# Patient Record
Sex: Female | Born: 2000 | Race: White | Hispanic: No | Marital: Single | Smoking: Former smoker
Health system: Southern US, Community
[De-identification: ages and names within clinical notes are randomized; demographics above are authoritative.]

## PROBLEM LIST (undated history)

## (undated) ENCOUNTER — Inpatient Hospital Stay: Payer: Self-pay

## (undated) DIAGNOSIS — F32A Depression, unspecified: Secondary | ICD-10-CM

## (undated) DIAGNOSIS — F1290 Cannabis use, unspecified, uncomplicated: Secondary | ICD-10-CM

## (undated) DIAGNOSIS — D649 Anemia, unspecified: Secondary | ICD-10-CM

## (undated) DIAGNOSIS — N879 Dysplasia of cervix uteri, unspecified: Secondary | ICD-10-CM

## (undated) DIAGNOSIS — Z8659 Personal history of other mental and behavioral disorders: Secondary | ICD-10-CM

## (undated) DIAGNOSIS — K3532 Acute appendicitis with perforation and localized peritonitis, without abscess: Secondary | ICD-10-CM

## (undated) DIAGNOSIS — R0600 Dyspnea, unspecified: Secondary | ICD-10-CM

## (undated) DIAGNOSIS — I300 Acute nonspecific idiopathic pericarditis: Secondary | ICD-10-CM

## (undated) DIAGNOSIS — I319 Disease of pericardium, unspecified: Secondary | ICD-10-CM

## (undated) DIAGNOSIS — K219 Gastro-esophageal reflux disease without esophagitis: Secondary | ICD-10-CM

## (undated) DIAGNOSIS — R519 Headache, unspecified: Secondary | ICD-10-CM

## (undated) DIAGNOSIS — Z87440 Personal history of urinary (tract) infections: Secondary | ICD-10-CM

## (undated) DIAGNOSIS — Z720 Tobacco use: Secondary | ICD-10-CM

## (undated) HISTORY — DX: Acute appendicitis with perforation and localized peritonitis, without abscess: K35.32

## (undated) HISTORY — DX: Headache, unspecified: R51.9

## (undated) HISTORY — DX: Disease of pericardium, unspecified: I31.9

## (undated) HISTORY — DX: Personal history of urinary (tract) infections: Z87.440

## (undated) HISTORY — DX: Anemia, unspecified: D64.9

## (undated) HISTORY — DX: Dyspnea, unspecified: R06.00

## (undated) HISTORY — DX: Acute nonspecific idiopathic pericarditis: I30.0

## (undated) HISTORY — DX: Personal history of other mental and behavioral disorders: Z86.59

## (undated) HISTORY — DX: Depression, unspecified: F32.A

## (undated) HISTORY — DX: Cannabis use, unspecified, uncomplicated: F12.90

---
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
## 2018-01-06 ENCOUNTER — Encounter: Payer: Self-pay | Admitting: Obstetrics & Gynecology

## 2018-01-06 NOTE — Telephone Encounter (Signed)
Can you please send in to pharmacy

## 2018-01-08 ENCOUNTER — Other Ambulatory Visit: Payer: Self-pay | Admitting: Obstetrics & Gynecology

## 2018-01-08 MED ORDER — PRENATAL MULTIVITAMIN CH: 1.0000 | ORAL_TABLET | Freq: Every day | ORAL | 4 refills | Status: DC

## 2018-01-08 NOTE — Telephone Encounter (Signed)
done

## 2018-01-14 ENCOUNTER — Ambulatory Visit (INDEPENDENT_AMBULATORY_CARE_PROVIDER_SITE_OTHER): Payer: Medicaid Other | Admitting: Obstetrics & Gynecology

## 2018-01-14 ENCOUNTER — Other Ambulatory Visit: Payer: Medicaid Other

## 2018-01-14 VITALS — BP 100/60 | Wt 159.0 lb

## 2018-01-14 DIAGNOSIS — Z130 Encounter for screening for diseases of the blood and blood-forming organs and certain disorders involving the immune mechanism: Secondary | ICD-10-CM

## 2018-01-14 DIAGNOSIS — Z87898 Personal history of other specified conditions: Secondary | ICD-10-CM

## 2018-01-14 DIAGNOSIS — Z3402 Encounter for supervision of normal first pregnancy, second trimester: Secondary | ICD-10-CM

## 2018-01-14 DIAGNOSIS — Z3490 Encounter for supervision of normal pregnancy, unspecified, unspecified trimester: Secondary | ICD-10-CM

## 2018-01-14 DIAGNOSIS — F1911 Other psychoactive substance abuse, in remission: Secondary | ICD-10-CM

## 2018-01-14 DIAGNOSIS — Z3A28 28 weeks gestation of pregnancy: Secondary | ICD-10-CM

## 2018-01-14 DIAGNOSIS — Z113 Encounter for screening for infections with a predominantly sexual mode of transmission: Secondary | ICD-10-CM

## 2018-01-14 DIAGNOSIS — Z131 Encounter for screening for diabetes mellitus: Secondary | ICD-10-CM

## 2018-01-14 NOTE — Progress Notes (Signed)
  Subjective  Fetal Movement? yes Contractions? no Leaking Fluid? no Vaginal Bleeding? no  Objective  BP 100/60   Wt 159 lb (72.1 kg)   LMP 06/14/2017 (Exact Date)   BMI 28.17 kg/m  General: NAD Pumonary: no increased work of breathing Abdomen: gravid, non-tender Extremities: no edema Psychiatric: mood appropriate, affect full  Assessment  17 y.o. G2P0 at [redacted]w[redacted]d by  04/08/2018, by Ultrasound presenting for routine prenatal visit  Plan   Problem List Items Addressed This Visit      Other   Supervision of normal first teen pregnancy in second trimester    Other Visit Diagnoses    [redacted] weeks gestation of pregnancy    -  Primary   History of substance abuse       Relevant Orders   Drug Screen, Urine    Glucola today Labor precautions discussed Edema prevention discussed  Barnett Applebaum, MD, [facility], [facility] 01/14/2018  11:12 AM

## 2018-01-14 NOTE — Patient Instructions (Signed)
Third Trimester of Pregnancy The third trimester is from week 28 through week 40 (months 7 through 9). The third trimester is a time when the unborn baby (fetus) is growing rapidly. At the end of the ninth month, the fetus is about 20 inches in length and weighs 6-10 pounds. Body changes during your third trimester Your body will continue to go through many changes during pregnancy. The changes vary from woman to woman. During the third trimester:  Your weight will continue to increase. You can expect to gain 25-35 pounds (11-16 kg) by the end of the pregnancy.  You may begin to get stretch marks on your hips, abdomen, and breasts.  You may urinate more often because the fetus is moving lower into your pelvis and pressing on your bladder.  You may develop or continue to have heartburn. This is caused by increased hormones that slow down muscles in the digestive tract.  You may develop or continue to have constipation because increased hormones slow digestion and cause the muscles that push waste through your intestines to relax.  You may develop hemorrhoids. These are swollen veins (varicose veins) in the rectum that can itch or be painful.  You may develop swollen, bulging veins (varicose veins) in your legs.  You may have increased body aches in the pelvis, back, or thighs. This is due to weight gain and increased hormones that are relaxing your joints.  You may have changes in your hair. These can include thickening of your hair, rapid growth, and changes in texture. Some women also have hair loss during or after pregnancy, or hair that feels dry or thin. Your hair will most likely return to normal after your baby is born.  Your breasts will continue to grow and they will continue to become tender. A yellow fluid (colostrum) may leak from your breasts. This is the first milk you are producing for your baby.  Your belly button may stick out.  You may notice more swelling in your hands,  face, or ankles.  You may have increased tingling or numbness in your hands, arms, and legs. The skin on your belly may also feel numb.  You may feel short of breath because of your expanding uterus.  You may have more problems sleeping. This can be caused by the size of your belly, increased need to urinate, and an increase in your body's metabolism.  You may notice the fetus "dropping," or moving lower in your abdomen (lightening).  You may have increased vaginal discharge.  You may notice your joints feel loose and you may have pain around your pelvic bone.  What to expect at prenatal visits You will have prenatal exams every 2 weeks until week 36. Then you will have weekly prenatal exams. During a routine prenatal visit:  You will be weighed to make sure you and the baby are growing normally.  Your blood pressure will be taken.  Your abdomen will be measured to track your baby's growth.  The fetal heartbeat will be listened to.  Any test results from the previous visit will be discussed.  You may have a cervical check near your due date to see if your cervix has softened or thinned (effaced).  You will be tested for Group B streptococcus. This happens between 35 and 37 weeks.  Your health care provider may ask you:  What your birth plan is.  How you are feeling.  If you are feeling the baby move.  If you have had   any abnormal symptoms, such as leaking fluid, bleeding, severe headaches, or abdominal cramping.  If you are using any tobacco products, including cigarettes, chewing tobacco, and electronic cigarettes.  If you have any questions.  Other tests or screenings that may be performed during your third trimester include:  Blood tests that check for low iron levels (anemia).  Fetal testing to check the health, activity level, and growth of the fetus. Testing is done if you have certain medical conditions or if there are problems during the  pregnancy.  Nonstress test (NST). This test checks the health of your baby to make sure there are no signs of problems, such as the baby not getting enough oxygen. During this test, a belt is placed around your belly. The baby is made to move, and its heart rate is monitored during movement.  What is false labor? False labor is a condition in which you feel small, irregular tightenings of the muscles in the womb (contractions) that usually go away with rest, changing position, or drinking water. These are called Braxton Hicks contractions. Contractions may last for hours, days, or even weeks before true labor sets in. If contractions come at regular intervals, become more frequent, increase in intensity, or become painful, you should see your health care provider. What are the signs of labor?  Abdominal cramps.  Regular contractions that start at 10 minutes apart and become stronger and more frequent with time.  Contractions that start on the top of the uterus and spread down to the lower abdomen and back.  Increased pelvic pressure and dull back pain.  A watery or bloody mucus discharge that comes from the vagina.  Leaking of amniotic fluid. This is also known as your "water breaking." It could be a slow trickle or a gush. Let your health care provider know if it has a color or strange odor. If you have any of these signs, call your health care provider right away, even if it is before your due date. Follow these instructions at home: Medicines  Follow your health care provider's instructions regarding medicine use. Specific medicines may be either safe or unsafe to take during pregnancy.  Take a prenatal vitamin that contains at least 600 micrograms (mcg) of folic acid.  If you develop constipation, try taking a stool softener if your health care provider approves. Eating and drinking  Eat a balanced diet that includes fresh fruits and vegetables, whole grains, good sources of protein  such as meat, eggs, or tofu, and low-fat dairy. Your health care provider will help you determine the amount of weight gain that is right for you.  Avoid raw meat and uncooked cheese. These carry germs that can cause birth defects in the baby.  If you have low calcium intake from food, talk to your health care provider about whether you should take a daily calcium supplement.  Eat four or five small meals rather than three large meals a day.  Limit foods that are high in fat and processed sugars, such as fried and sweet foods.  To prevent constipation: ? Drink enough fluid to keep your urine clear or pale yellow. ? Eat foods that are high in fiber, such as fresh fruits and vegetables, whole grains, and beans. Activity  Exercise only as directed by your health care provider. Most women can continue their usual exercise routine during pregnancy. Try to exercise for 30 minutes at least 5 days a week. Stop exercising if you experience uterine contractions.  Avoid heavy   lifting.  Do not exercise in extreme heat or humidity, or at high altitudes.  Wear low-heel, comfortable shoes.  Practice good posture.  You may continue to have sex unless your health care provider tells you otherwise. Relieving pain and discomfort  Take frequent breaks and rest with your legs elevated if you have leg cramps or low back pain.  Take warm sitz baths to soothe any pain or discomfort caused by hemorrhoids. Use hemorrhoid cream if your health care provider approves.  Wear a good support bra to prevent discomfort from breast tenderness.  If you develop varicose veins: ? Wear support pantyhose or compression stockings as told by your healthcare provider. ? Elevate your feet for 15 minutes, 3-4 times a day. Prenatal care  Write down your questions. Take them to your prenatal visits.  Keep all your prenatal visits as told by your health care provider. This is important. Safety  Wear your seat belt at  all times when driving.  Make a list of emergency phone numbers, including numbers for family, friends, the hospital, and police and fire departments. General instructions  Avoid cat litter boxes and soil used by cats. These carry germs that can cause birth defects in the baby. If you have a cat, ask someone to clean the litter box for you.  Do not travel far distances unless it is absolutely necessary and only with the approval of your health care provider.  Do not use hot tubs, steam rooms, or saunas.  Do not drink alcohol.  Do not use any products that contain nicotine or tobacco, such as cigarettes and e-cigarettes. If you need help quitting, ask your health care provider.  Do not use any medicinal herbs or unprescribed drugs. These chemicals affect the formation and growth of the baby.  Do not douche or use tampons or scented sanitary pads.  Do not cross your legs for long periods of time.  To prepare for the arrival of your baby: ? Take prenatal classes to understand, practice, and ask questions about labor and delivery. ? Make a trial run to the hospital. ? Visit the hospital and tour the maternity area. ? Arrange for maternity or paternity leave through employers. ? Arrange for family and friends to take care of pets while you are in the hospital. ? Purchase a rear-facing car seat and make sure you know how to install it in your car. ? Pack your hospital bag. ? Prepare the baby's nursery. Make sure to remove all pillows and stuffed animals from the baby's crib to prevent suffocation.  Visit your dentist if you have not gone during your pregnancy. Use a soft toothbrush to brush your teeth and be gentle when you floss. Contact a health care provider if:  You are unsure if you are in labor or if your water has broken.  You become dizzy.  You have mild pelvic cramps, pelvic pressure, or nagging pain in your abdominal area.  You have lower back pain.  You have persistent  nausea, vomiting, or diarrhea.  You have an unusual or bad smelling vaginal discharge.  You have pain when you urinate. Get help right away if:  Your water breaks before 37 weeks.  You have regular contractions less than 5 minutes apart before 37 weeks.  You have a fever.  You are leaking fluid from your vagina.  You have spotting or bleeding from your vagina.  You have severe abdominal pain or cramping.  You have rapid weight loss or weight gain.    You have shortness of breath with chest pain.  You notice sudden or extreme swelling of your face, hands, ankles, feet, or legs.  Your baby makes fewer than 10 movements in 2 hours.  You have severe headaches that do not go away when you take medicine.  You have vision changes. Summary  The third trimester is from week 28 through week 40, months 7 through 9. The third trimester is a time when the unborn baby (fetus) is growing rapidly.  During the third trimester, your discomfort may increase as you and your baby continue to gain weight. You may have abdominal, leg, and back pain, sleeping problems, and an increased need to urinate.  During the third trimester your breasts will keep growing and they will continue to become tender. A yellow fluid (colostrum) may leak from your breasts. This is the first milk you are producing for your baby.  False labor is a condition in which you feel small, irregular tightenings of the muscles in the womb (contractions) that eventually go away. These are called Braxton Hicks contractions. Contractions may last for hours, days, or even weeks before true labor sets in.  Signs of labor can include: abdominal cramps; regular contractions that start at 10 minutes apart and become stronger and more frequent with time; watery or bloody mucus discharge that comes from the vagina; increased pelvic pressure and dull back pain; and leaking of amniotic fluid. This information is not intended to replace advice  given to you by your health care provider. Make sure you discuss any questions you have with your health care provider. Document Released: 08/06/2001 Document Revised: 01/18/2016 Document Reviewed: 10/13/2012 Elsevier Interactive Patient Education  2017 [employer]  

## 2018-01-15 LAB — 28 WEEK RH+PANEL
BASOS ABS: 0 10*3/uL (ref 0.0–0.2)
Basos: 0 %
EOS (ABSOLUTE): 0.4 10*3/uL (ref 0.0–0.4)
EOS: 3 %
Gestational Diabetes Screen: 97 mg/dL (ref 65–139)
HIV SCREEN 4TH GENERATION: NONREACTIVE
Hematocrit: 30.4 % — ABNORMAL LOW (ref 34.0–46.6)
Hemoglobin: 10.1 g/dL — ABNORMAL LOW (ref 11.1–15.9)
IMMATURE GRANULOCYTES: 5 %
Immature Grans (Abs): 0.6 10*3/uL — ABNORMAL HIGH (ref 0.0–0.1)
Lymphocytes Absolute: 2.2 10*3/uL (ref 0.7–3.1)
Lymphs: 19 %
MCH: 29.4 pg (ref 26.6–33.0)
MCHC: 33.2 g/dL (ref 31.5–35.7)
MCV: 89 fL (ref 79–97)
MONOCYTES: 12 %
Monocytes Absolute: 1.4 10*3/uL — ABNORMAL HIGH (ref 0.1–0.9)
NEUTROS PCT: 61 %
Neutrophils Absolute: 7.1 10*3/uL — ABNORMAL HIGH (ref 1.4–7.0)
PLATELETS: 217 10*3/uL (ref 150–450)
RBC: 3.43 x10E6/uL — ABNORMAL LOW (ref 3.77–5.28)
RDW: 12.9 % (ref 12.3–15.4)
RPR Ser Ql: NONREACTIVE
WBC: 11.7 10*3/uL — ABNORMAL HIGH (ref 3.4–10.8)

## 2018-01-15 LAB — DRUG SCREEN, URINE
AMPHETAMINES, URINE: NEGATIVE ng/mL
BARBITURATE SCREEN URINE: NEGATIVE ng/mL
BENZODIAZEPINE QUANT UR: NEGATIVE ng/mL
Cannabinoid Quant, Ur: POSITIVE ng/mL — AB
Cocaine (Metab.): NEGATIVE ng/mL
OPIATE QUANT UR: NEGATIVE ng/mL
PCP Quant, Ur: NEGATIVE ng/mL

## 2018-01-28 ENCOUNTER — Encounter: Payer: Self-pay | Admitting: Advanced Practice Midwife

## 2018-01-28 ENCOUNTER — Ambulatory Visit (INDEPENDENT_AMBULATORY_CARE_PROVIDER_SITE_OTHER): Payer: Medicaid Other | Admitting: Advanced Practice Midwife

## 2018-01-28 VITALS — BP 100/60 | Wt 156.0 lb

## 2018-01-28 DIAGNOSIS — Z3403 Encounter for supervision of normal first pregnancy, third trimester: Secondary | ICD-10-CM

## 2018-01-28 DIAGNOSIS — Z3A30 30 weeks gestation of pregnancy: Secondary | ICD-10-CM | POA: Diagnosis not present

## 2018-01-28 DIAGNOSIS — Z23 Encounter for immunization: Secondary | ICD-10-CM

## 2018-01-28 NOTE — Progress Notes (Signed)
No concerns. Tdap and blood tx signed today. rj

## 2018-01-28 NOTE — Patient Instructions (Signed)
Third Trimester of Pregnancy The third trimester is from week 28 through week 40 (months 7 through 9). The third trimester is a time when the unborn baby (fetus) is growing rapidly. At the end of the ninth month, the fetus is about 20 inches in length and weighs 6-10 pounds. Body changes during your third trimester Your body will continue to go through many changes during pregnancy. The changes vary from woman to woman. During the third trimester:  Your weight will continue to increase. You can expect to gain 25-35 pounds (11-16 kg) by the end of the pregnancy.  You may begin to get stretch marks on your hips, abdomen, and breasts.  You may urinate more often because the fetus is moving lower into your pelvis and pressing on your bladder.  You may develop or continue to have heartburn. This is caused by increased hormones that slow down muscles in the digestive tract.  You may develop or continue to have constipation because increased hormones slow digestion and cause the muscles that push waste through your intestines to relax.  You may develop hemorrhoids. These are swollen veins (varicose veins) in the rectum that can itch or be painful.  You may develop swollen, bulging veins (varicose veins) in your legs.  You may have increased body aches in the pelvis, back, or thighs. This is due to weight gain and increased hormones that are relaxing your joints.  You may have changes in your hair. These can include thickening of your hair, rapid growth, and changes in texture. Some women also have hair loss during or after pregnancy, or hair that feels dry or thin. Your hair will most likely return to normal after your baby is born.  Your breasts will continue to grow and they will continue to become tender. A yellow fluid (colostrum) may leak from your breasts. This is the first milk you are producing for your baby.  Your belly button may stick out.  You may notice more swelling in your hands,  face, or ankles.  You may have increased tingling or numbness in your hands, arms, and legs. The skin on your belly may also feel numb.  You may feel short of breath because of your expanding uterus.  You may have more problems sleeping. This can be caused by the size of your belly, increased need to urinate, and an increase in your body's metabolism.  You may notice the fetus "dropping," or moving lower in your abdomen (lightening).  You may have increased vaginal discharge.  You may notice your joints feel loose and you may have pain around your pelvic bone.  What to expect at prenatal visits You will have prenatal exams every 2 weeks until week 36. Then you will have weekly prenatal exams. During a routine prenatal visit:  You will be weighed to make sure you and the baby are growing normally.  Your blood pressure will be taken.  Your abdomen will be measured to track your baby's growth.  The fetal heartbeat will be listened to.  Any test results from the previous visit will be discussed.  You may have a cervical check near your due date to see if your cervix has softened or thinned (effaced).  You will be tested for Group B streptococcus. This happens between 35 and 37 weeks.  Your health care provider may ask you:  What your birth plan is.  How you are feeling.  If you are feeling the baby move.  If you have had   any abnormal symptoms, such as leaking fluid, bleeding, severe headaches, or abdominal cramping.  If you are using any tobacco products, including cigarettes, chewing tobacco, and electronic cigarettes.  If you have any questions.  Other tests or screenings that may be performed during your third trimester include:  Blood tests that check for low iron levels (anemia).  Fetal testing to check the health, activity level, and growth of the fetus. Testing is done if you have certain medical conditions or if there are problems during the  pregnancy.  Nonstress test (NST). This test checks the health of your baby to make sure there are no signs of problems, such as the baby not getting enough oxygen. During this test, a belt is placed around your belly. The baby is made to move, and its heart rate is monitored during movement.  What is false labor? False labor is a condition in which you feel small, irregular tightenings of the muscles in the womb (contractions) that usually go away with rest, changing position, or drinking water. These are called Braxton Hicks contractions. Contractions may last for hours, days, or even weeks before true labor sets in. If contractions come at regular intervals, become more frequent, increase in intensity, or become painful, you should see your health care provider. What are the signs of labor?  Abdominal cramps.  Regular contractions that start at 10 minutes apart and become stronger and more frequent with time.  Contractions that start on the top of the uterus and spread down to the lower abdomen and back.  Increased pelvic pressure and dull back pain.  A watery or bloody mucus discharge that comes from the vagina.  Leaking of amniotic fluid. This is also known as your "water breaking." It could be a slow trickle or a gush. Let your health care provider know if it has a color or strange odor. If you have any of these signs, call your health care provider right away, even if it is before your due date. Follow these instructions at home: Medicines  Follow your health care provider's instructions regarding medicine use. Specific medicines may be either safe or unsafe to take during pregnancy.  Take a prenatal vitamin that contains at least 600 micrograms (mcg) of folic acid.  If you develop constipation, try taking a stool softener if your health care provider approves. Eating and drinking  Eat a balanced diet that includes fresh fruits and vegetables, whole grains, good sources of protein  such as meat, eggs, or tofu, and low-fat dairy. Your health care provider will help you determine the amount of weight gain that is right for you.  Avoid raw meat and uncooked cheese. These carry germs that can cause birth defects in the baby.  If you have low calcium intake from food, talk to your health care provider about whether you should take a daily calcium supplement.  Eat four or five small meals rather than three large meals a day.  Limit foods that are high in fat and processed sugars, such as fried and sweet foods.  To prevent constipation: ? Drink enough fluid to keep your urine clear or pale yellow. ? Eat foods that are high in fiber, such as fresh fruits and vegetables, whole grains, and beans. Activity  Exercise only as directed by your health care provider. Most women can continue their usual exercise routine during pregnancy. Try to exercise for 30 minutes at least 5 days a week. Stop exercising if you experience uterine contractions.  Avoid heavy   lifting.  Do not exercise in extreme heat or humidity, or at high altitudes.  Wear low-heel, comfortable shoes.  Practice good posture.  You may continue to have sex unless your health care provider tells you otherwise. Relieving pain and discomfort  Take frequent breaks and rest with your legs elevated if you have leg cramps or low back pain.  Take warm sitz baths to soothe any pain or discomfort caused by hemorrhoids. Use hemorrhoid cream if your health care provider approves.  Wear a good support bra to prevent discomfort from breast tenderness.  If you develop varicose veins: ? Wear support pantyhose or compression stockings as told by your healthcare provider. ? Elevate your feet for 15 minutes, 3-4 times a day. Prenatal care  Write down your questions. Take them to your prenatal visits.  Keep all your prenatal visits as told by your health care provider. This is important. Safety  Wear your seat belt at  all times when driving.  Make a list of emergency phone numbers, including numbers for family, friends, the hospital, and police and fire departments. General instructions  Avoid cat litter boxes and soil used by cats. These carry germs that can cause birth defects in the baby. If you have a cat, ask someone to clean the litter box for you.  Do not travel far distances unless it is absolutely necessary and only with the approval of your health care provider.  Do not use hot tubs, steam rooms, or saunas.  Do not drink alcohol.  Do not use any products that contain nicotine or tobacco, such as cigarettes and e-cigarettes. If you need help quitting, ask your health care provider.  Do not use any medicinal herbs or unprescribed drugs. These chemicals affect the formation and growth of the baby.  Do not douche or use tampons or scented sanitary pads.  Do not cross your legs for long periods of time.  To prepare for the arrival of your baby: ? Take prenatal classes to understand, practice, and ask questions about labor and delivery. ? Make a trial run to the hospital. ? Visit the hospital and tour the maternity area. ? Arrange for maternity or paternity leave through employers. ? Arrange for family and friends to take care of pets while you are in the hospital. ? Purchase a rear-facing car seat and make sure you know how to install it in your car. ? Pack your hospital bag. ? Prepare the baby's nursery. Make sure to remove all pillows and stuffed animals from the baby's crib to prevent suffocation.  Visit your dentist if you have not gone during your pregnancy. Use a soft toothbrush to brush your teeth and be gentle when you floss. Contact a health care provider if:  You are unsure if you are in labor or if your water has broken.  You become dizzy.  You have mild pelvic cramps, pelvic pressure, or nagging pain in your abdominal area.  You have lower back pain.  You have persistent  nausea, vomiting, or diarrhea.  You have an unusual or bad smelling vaginal discharge.  You have pain when you urinate. Get help right away if:  Your water breaks before 37 weeks.  You have regular contractions less than 5 minutes apart before 37 weeks.  You have a fever.  You are leaking fluid from your vagina.  You have spotting or bleeding from your vagina.  You have severe abdominal pain or cramping.  You have rapid weight loss or weight gain.    You have shortness of breath with chest pain.  You notice sudden or extreme swelling of your face, hands, ankles, feet, or legs.  Your baby makes fewer than 10 movements in 2 hours.  You have severe headaches that do not go away when you take medicine.  You have vision changes. Summary  The third trimester is from week 28 through week 40, months 7 through 9. The third trimester is a time when the unborn baby (fetus) is growing rapidly.  During the third trimester, your discomfort may increase as you and your baby continue to gain weight. You may have abdominal, leg, and back pain, sleeping problems, and an increased need to urinate.  During the third trimester your breasts will keep growing and they will continue to become tender. A yellow fluid (colostrum) may leak from your breasts. This is the first milk you are producing for your baby.  False labor is a condition in which you feel small, irregular tightenings of the muscles in the womb (contractions) that eventually go away. These are called Braxton Hicks contractions. Contractions may last for hours, days, or even weeks before true labor sets in.  Signs of labor can include: abdominal cramps; regular contractions that start at 10 minutes apart and become stronger and more frequent with time; watery or bloody mucus discharge that comes from the vagina; increased pelvic pressure and dull back pain; and leaking of amniotic fluid. This information is not intended to replace advice  given to you by your health care provider. Make sure you discuss any questions you have with your health care provider. Document Released: 08/06/2001 Document Revised: 01/18/2016 Document Reviewed: 10/13/2012 Elsevier Interactive Patient Education  2017 [employer]  

## 2018-01-28 NOTE — Progress Notes (Signed)
  Routine Prenatal Care Visit  Subjective  Brittany Vaughn is a 17 y.o. G2P0 at [redacted]w[redacted]d being seen today for ongoing prenatal care.  She is currently monitored for the following issues for this low-risk pregnancy and has Encounter for supervision of low-risk pregnancy, antepartum; Supervision of normal first teen pregnancy in second trimester; Headache in pregnancy, antepartum, second trimester; Marijuana use; Acute chest pain; and Acute idiopathic pericarditis on their problem list.  ----------------------------------------------------------------------------------- Patient reports no complaints.   Contractions: Not present. Vag. Bleeding: None.  Movement: Present. Denies leaking of fluid.  ----------------------------------------------------------------------------------- The following portions of the patient's history were reviewed and updated as appropriate: allergies, current medications, past family history, past medical history, past social history, past surgical history and problem list. Problem list updated.   Objective  Blood pressure 100/60, weight 156 lb (70.8 kg), last menstrual period 06/14/2017. Pregravid weight 138 lb (62.6 kg) Total Weight Gain 18 lb (8.165 kg) Urinalysis:      Fetal Status: Fetal Heart Rate (bpm): 137 Fundal Height: 31 cm Movement: Present     General:  Alert, oriented and cooperative. Patient is in no acute distress.  Skin: Skin is warm and dry. No rash noted.   Cardiovascular: Normal heart rate noted  Respiratory: Normal respiratory effort, no problems with respiration noted  Abdomen: Soft, gravid, appropriate for gestational age. Pain/Pressure: Absent     Pelvic:  Cervical exam deferred        Extremities: Normal range of motion.  Edema: None  Mental Status: Normal mood and affect. Normal behavior. Normal judgment and thought content.   Assessment   17 y.o. G2P0 at [redacted]w[redacted]d by  04/08/2018, by Ultrasound presenting for routine prenatal visit  Plan    pregnancy1 Problems (from 06/14/17 to present)    Problem Noted Resolved   Encounter for supervision of low-risk pregnancy, antepartum 10/02/2017 by Gae Dry, MD No   Overview Addendum 11/06/2017  6:38 PM by Dalia Heading, [place] Prenatal Labs  Dating  Blood type: O/Positive/-- (02/07 0909)   Genetic Screen 1 Screen:     Quad: neg    NIPS: Antibody:Negative (02/07 0909)  Anatomic [place]  Rubella: 2.67 (02/07 0909) Varicella:    GTT Early:               Third trimester:  RPR: Non Reactive (02/07 0909)   Rhogam  HBsAg: Negative (02/07 0909)   TDaP vaccine                       Flu Shot: HIV: Non Reactive (02/07 0909)   Baby Food                                GBS:   Contraception  Pap:  CBB     CS/VBAC    Support Person                  Preterm labor symptoms and general obstetric precautions including but not limited to vaginal bleeding, contractions, leaking of fluid and fetal movement were reviewed in detail with the patient. Please refer to After Visit Summary for other counseling recommendations.   Return in about 2 weeks (around 02/11/2018) for rob.  Rod Can, CNM 01/28/2018 3:06 PM

## 2018-02-11 ENCOUNTER — Encounter: Payer: Self-pay | Admitting: Obstetrics and Gynecology

## 2018-02-11 ENCOUNTER — Ambulatory Visit (INDEPENDENT_AMBULATORY_CARE_PROVIDER_SITE_OTHER): Payer: Medicaid Other | Admitting: Obstetrics and Gynecology

## 2018-02-11 VITALS — BP 100/60 | Wt 159.0 lb

## 2018-02-11 DIAGNOSIS — Z3490 Encounter for supervision of normal pregnancy, unspecified, unspecified trimester: Secondary | ICD-10-CM

## 2018-02-11 DIAGNOSIS — F1290 Cannabis use, unspecified, uncomplicated: Secondary | ICD-10-CM

## 2018-02-11 DIAGNOSIS — O99013 Anemia complicating pregnancy, third trimester: Secondary | ICD-10-CM | POA: Insufficient documentation

## 2018-02-11 DIAGNOSIS — Z3402 Encounter for supervision of normal first pregnancy, second trimester: Secondary | ICD-10-CM

## 2018-02-11 DIAGNOSIS — O99343 Other mental disorders complicating pregnancy, third trimester: Secondary | ICD-10-CM

## 2018-02-11 DIAGNOSIS — F329 Major depressive disorder, single episode, unspecified: Secondary | ICD-10-CM | POA: Insufficient documentation

## 2018-02-11 DIAGNOSIS — Z3A32 32 weeks gestation of pregnancy: Secondary | ICD-10-CM

## 2018-02-11 DIAGNOSIS — Z3493 Encounter for supervision of normal pregnancy, unspecified, third trimester: Secondary | ICD-10-CM

## 2018-02-11 MED ORDER — FERROUS SULFATE 325 (65 FE) MG PO TABS: 325.0000 mg | ORAL_TABLET | Freq: Two times a day (BID) | ORAL | 1 refills | Status: DC

## 2018-02-11 NOTE — Progress Notes (Signed)
Routine Prenatal Care Visit  Subjective  Brittany Vaughn is a 17 y.o. G2P0 at 99w0dbeing seen today for ongoing prenatal care.  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  ----------------------------------------------------------------------------------- Patient reports backache.   Contractions: Not present. Vag. Bleeding: None.  Movement: Present. Denies leaking of fluid.  ----------------------------------------------------------------------------------- The following portions of the patient's history were reviewed and updated as appropriate: allergies, current medications, past family history, past medical history, past social history, past surgical history and problem list. Problem list updated.   Objective  Blood pressure 100/60, weight 159 lb (72.1 kg), last menstrual period 06/14/2017. Pregravid weight 138 lb (62.6 kg) Total Weight Gain 21 lb (9.526 kg) Urinalysis: Urine Protein: Negative Urine Glucose: Negative  Fetal Status:     Movement: Present     General:  Alert, oriented and cooperative. Patient is in no acute distress.  Skin: Skin is warm and dry. No rash noted.   Cardiovascular: Normal heart rate noted  Respiratory: Normal respiratory effort, no problems with respiration noted  Abdomen: Soft, gravid, appropriate for gestational age. Pain/Pressure: Present     Pelvic:  Cervical exam deferred        Extremities: Normal range of motion.     ental Status: Normal mood and affect. Normal behavior. Normal judgment and thought content.     Assessment   17y.o. 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  Patient is starting a support group on 02/12/18.       Anemia in pregnancy, third trimester 02/11/2018 by ScHomero FellersMD No   Encounter for supervision of low-risk pregnancy, antepartum 10/02/2017 by HaGae DryMD No   Overview Addendum 02/11/2018  2:12 PM by ScHomero FellersMD    Clinic [facility] Prenatal Labs  Dating 13 week USKorealood type: O/Positive/-- (02/07 0909)   Genetic Screen  Quad: neg  Antibody:Negative (02/07 0909)  Anatomic US[place]ore[place]omplete Rubella: 2.67 (02/07 098032Varicella:    GTT Early:                Third trimester: 97  RPR: Non Reactive (02/07 0909)   Rhogam Not indicated HBsAg: Negative (02/07 0909)   TDaP vaccine   01/28/18                     Flu Shot: 09/2017 HIV: Non Reactive (02/07 0909)   Baby Food    Breast                            GBS:   Contraception Paragaurd Pap: under 21  CBB     CS/VBAC Not applicable   Support Person                  Gestational age appropriate obstetric precautions including  but not limited to vaginal bleeding, contractions, leaking of fluid and fetal movement were reviewed in detail with the patient.    Patient met with Leda Gauze before appointment Given information on prenatal classes with [facility].  Given information on birthcontrol options postpartum. Recommended [email or web address].  She is considering a Paraguard. Given information on volunteer doula program at the hospital.  Discussed breast feeding. Patient is planning on breast feeding. Patient was given resources and lactation consultation was advised.  Anemia- prescription sent for twice a day iron  to the pharmacy.  Recheck at 36 weeks, if lower consider hematology consult.   Return in about 2 weeks (around 02/25/2018) for ROB.  Adrian Prows MD [facility], [facility] 02/11/18 2:32 PM

## 2018-02-12 MED ORDER — TETANUS-DIPHTH-ACELL PERTUSSIS 5-2.5-18.5 LF-MCG/0.5 IM SUSP
0.5000 mL | Freq: Once | INTRAMUSCULAR | Status: AC
  Administered 2018-01-28: 0.5 mL via INTRAMUSCULAR

## 2018-02-12 NOTE — Telephone Encounter (Signed)
This encounter was created in error - please disregard.

## 2018-02-12 NOTE — Addendum Note (Signed)
Addended by: Cleophas Dunker D on: 02/12/2018 11:52 AM   Modules accepted: Orders

## 2018-02-25 ENCOUNTER — Encounter: Payer: Self-pay | Admitting: Maternal Newborn

## 2018-02-25 ENCOUNTER — Ambulatory Visit (INDEPENDENT_AMBULATORY_CARE_PROVIDER_SITE_OTHER): Payer: Medicaid Other | Admitting: Maternal Newborn

## 2018-02-25 VITALS — BP 90/60 | Wt 164.0 lb

## 2018-02-25 DIAGNOSIS — Z3402 Encounter for supervision of normal first pregnancy, second trimester: Secondary | ICD-10-CM

## 2018-02-25 DIAGNOSIS — Z3A34 34 weeks gestation of pregnancy: Secondary | ICD-10-CM

## 2018-02-25 NOTE — Progress Notes (Signed)
No concerns.rj 

## 2018-02-25 NOTE — Progress Notes (Signed)
Routine Prenatal Care Visit  Subjective  Brittany Vaughn is a 17 y.o. G2P0 at [redacted]w[redacted]d being seen today for ongoing prenatal care.  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  ----------------------------------------------------------------------------------- Patient reports fatigue. Contractions: Not present. Vag. Bleeding: None.  Movement: Present. No leaking of fluid.  ----------------------------------------------------------------------------------- The following portions of the patient's history were reviewed and updated as appropriate: allergies, current medications, past family history, past medical history, past social history, past surgical history and problem list. Problem list updated.   Objective  Blood pressure 90/60, weight 164 lb (74.4 kg), last menstrual period 06/14/2017. Pregravid weight 138 lb (62.6 kg) Total Weight Gain 26 lb (11.8 kg) Urinalysis: Urine Protein: Negative Urine Glucose: Negative  Fetal Status: Fetal Heart Rate (bpm): 140 Fundal Height: 34 cm Movement: Present     General:  Alert, oriented and cooperative. Patient is in no acute distress.  Skin: Skin is warm and dry. No rash noted.   Cardiovascular: Normal heart rate noted  Respiratory: Normal respiratory effort, no problems with respiration noted  Abdomen: Soft, gravid, appropriate for gestational age. Pain/Pressure: Present     Pelvic:  Cervical exam deferred        Extremities: Normal range of motion.  Edema: None  Mental Status: Normal mood and affect. Normal behavior. Normal judgment and thought  content.     Assessment   17 y.o. G2P0 at [redacted]w[redacted]d, EDD 04/08/2018 by Ultrasound presenting for routine prenatal visit.  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  Patient is starting a support group on 02/12/18.       Anemia in pregnancy, third trimester 02/11/2018 by Homero Fellers, MD No   Encounter for supervision of low-risk pregnancy, antepartum 10/02/2017 by Gae Dry, MD No   Overview Addendum 02/11/2018  2:32 PM by Homero Fellers, MD    Clinic [facility] Prenatal Labs  Dating 13 week [place] Blood type: O/Positive/-- (02/07 0909)   Genetic Screen  Quad: neg  Antibody:Negative (02/07 0909)  Anatomic [place]  complete Rubella: 2.67 (02/07 6333) Varicella:    GTT Early:                Third trimester: 97  RPR: Non Reactive (02/07 0909)   Rhogam Not indicated HBsAg: Negative (02/07 0909)   TDaP vaccine   01/28/18                     Flu Shot: 09/2017 HIV: Non Reactive (02/07 0909)   Baby Food   Breast                             GBS:   Contraception paraguard Pap: under 21  CBB     CS/VBAC Not applicalbe   Support Person               Recheck hemoglobin next visit.  Preterm labor  symptoms and general obstetric precautions including but not limited to vaginal bleeding, contractions, leaking of fluid and fetal movement were reviewed.  Return in about 2 weeks (around 03/11/2018) for ROB.  Avel Sensor, CNM 02/25/2018  11:34 AM

## 2018-03-11 ENCOUNTER — Ambulatory Visit (INDEPENDENT_AMBULATORY_CARE_PROVIDER_SITE_OTHER): Payer: Medicaid Other | Admitting: Obstetrics & Gynecology

## 2018-03-11 ENCOUNTER — Other Ambulatory Visit (HOSPITAL_COMMUNITY)
Admission: RE | Admit: 2018-03-11 | Discharge: 2018-03-11 | Disposition: A | Discharge status: Home / Self Care | Payer: Medicaid Other | Source: Ambulatory Visit | Attending: Obstetrics & Gynecology | Admitting: Obstetrics & Gynecology

## 2018-03-11 VITALS — BP 90/58 | Wt 164.0 lb

## 2018-03-11 DIAGNOSIS — Z3A36 36 weeks gestation of pregnancy: Secondary | ICD-10-CM | POA: Insufficient documentation

## 2018-03-11 DIAGNOSIS — Z113 Encounter for screening for infections with a predominantly sexual mode of transmission: Secondary | ICD-10-CM | POA: Diagnosis present

## 2018-03-11 DIAGNOSIS — O99019 Anemia complicating pregnancy, unspecified trimester: Secondary | ICD-10-CM

## 2018-03-11 DIAGNOSIS — Z112 Encounter for screening for other bacterial diseases: Secondary | ICD-10-CM

## 2018-03-11 DIAGNOSIS — Z3402 Encounter for supervision of normal first pregnancy, second trimester: Secondary | ICD-10-CM

## 2018-03-11 DIAGNOSIS — Z3483 Encounter for supervision of other normal pregnancy, third trimester: Secondary | ICD-10-CM | POA: Diagnosis not present

## 2018-03-11 NOTE — Progress Notes (Signed)
  Subjective  Fetal Movement? yes Contractions? no Leaking Fluid? no Vaginal Bleeding? no  Objective  BP (!) 90/58   Wt 164 lb (74.4 kg)   LMP 06/14/2017 (Exact Date)   BMI 29.05 kg/m  General: NAD Pumonary: no increased work of breathing Abdomen: gravid, non-tender Extremities: no edema Psychiatric: mood appropriate, affect full  Assessment  17 y.o. G2P0 at [redacted]w[redacted]d by  04/08/2018, by Ultrasound presenting for routine prenatal visit  Plan   Problem List Items Addressed This Visit      Other   Supervision of normal first teen pregnancy in second trimester    Other Visit Diagnoses    [redacted] weeks gestation of pregnancy    -  Primary   Relevant Orders   Cervicovaginal ancillary only   Anemia affecting first pregnancy       Relevant Orders   Hemoglobin   Encounter for screening examination for bacterial disease       Relevant Orders   Culture, beta strep (group b only)   Screen for STD (sexually transmitted disease)       Relevant Orders   Cervicovaginal ancillary only    Labor precautions discussed  Barnett Applebaum, MD, [facility], [facility] 03/11/2018  11:16 AM

## 2018-03-11 NOTE — Patient Instructions (Signed)
Braxton Hicks Contractions °Contractions of the uterus can occur throughout pregnancy, but they are not always a sign that you are in labor. You may have practice contractions called Braxton Hicks contractions. These false labor contractions are sometimes confused with true labor. °What are Braxton Hicks contractions? °Braxton Hicks contractions are tightening movements that occur in the muscles of the uterus before labor. Unlike true labor contractions, these contractions do not result in opening (dilation) and thinning of the cervix. Toward the end of pregnancy (32-34 weeks), Braxton Hicks contractions can happen more often and may become stronger. These contractions are sometimes difficult to tell apart from true labor because they can be very uncomfortable. You should not feel embarrassed if you go to the hospital with false labor. °Sometimes, the only way to tell if you are in true labor is for your health care provider to look for changes in the cervix. The health care provider will do a physical exam and may monitor your contractions. If you are not in true labor, the exam should show that your cervix is not dilating and your water has not broken. °If there are other health problems associated with your pregnancy, it is completely safe for you to be sent home with false labor. You may continue to have Braxton Hicks contractions until you go into true labor. °How to tell the difference between true labor and false labor °True labor °· Contractions last 30-70 seconds. °· Contractions become very regular. °· Discomfort is usually felt in the top of the uterus, and it spreads to the lower abdomen and low back. °· Contractions do not go away with walking. °· Contractions usually become more intense and increase in frequency. °· The cervix dilates and gets thinner. °False labor °· Contractions are usually shorter and not as strong as true labor contractions. °· Contractions are usually irregular. °· Contractions  are often felt in the front of the lower abdomen and in the groin. °· Contractions may go away when you walk around or change positions while lying down. °· Contractions get weaker and are shorter-lasting as time goes on. °· The cervix usually does not dilate or become thin. °Follow these instructions at home: °· Take over-the-counter and prescription medicines only as told by your health care provider. °· Keep up with your usual exercises and follow other instructions from your health care provider. °· Eat and drink lightly if you think you are going into labor. °· If Braxton Hicks contractions are making you uncomfortable: °? Change your position from lying down or resting to walking, or change from walking to resting. °? Sit and rest in a tub of warm water. °? Drink enough fluid to keep your urine pale yellow. Dehydration may cause these contractions. °? Do slow and deep breathing several times an hour. °· Keep all follow-up prenatal visits as told by your health care provider. This is important. °Contact a health care provider if: °· You have a fever. °· You have continuous pain in your abdomen. °Get help right away if: °· Your contractions become stronger, more regular, and closer together. °· You have fluid leaking or gushing from your vagina. °· You pass blood-tinged mucus (bloody show). °· You have bleeding from your vagina. °· You have low back pain that you never had before. °· You feel your baby’s head pushing down and causing pelvic pressure. °· Your baby is not moving inside you as much as it used to. °Summary °· Contractions that occur before labor are called Braxton   Hicks contractions, false labor, or practice contractions. °· Braxton Hicks contractions are usually shorter, weaker, farther apart, and less regular than true labor contractions. True labor contractions usually become progressively stronger and regular and they become more frequent. °· Manage discomfort from Braxton Hicks contractions by  changing position, resting in a warm bath, drinking plenty of water, or practicing deep breathing. °This information is not intended to replace advice given to you by your health care provider. Make sure you discuss any questions you have with your health care provider. °Document Released: 12/26/2016 Document Revised: 12/26/2016 Document Reviewed: 12/26/2016 °Elsevier Interactive Patient Education © 2018 [employer] ° °

## 2018-03-12 LAB — HEMOGLOBIN: Hemoglobin: 11.9 g/dL (ref 11.1–15.9)

## 2018-03-13 LAB — CERVICOVAGINAL ANCILLARY ONLY
Chlamydia: NEGATIVE
Neisseria Gonorrhea: NEGATIVE
Trichomonas: NEGATIVE

## 2018-03-15 LAB — CULTURE, BETA STREP (GROUP B ONLY): Strep Gp B Culture: NEGATIVE

## 2018-03-16 ENCOUNTER — Encounter: Payer: Self-pay | Admitting: Obstetrics & Gynecology

## 2018-03-19 ENCOUNTER — Encounter: Payer: Self-pay | Admitting: Obstetrics and Gynecology

## 2018-03-19 ENCOUNTER — Ambulatory Visit (INDEPENDENT_AMBULATORY_CARE_PROVIDER_SITE_OTHER): Payer: Medicaid Other | Admitting: Obstetrics and Gynecology

## 2018-03-19 VITALS — BP 96/60 | Wt 172.0 lb

## 2018-03-19 DIAGNOSIS — Z3A37 37 weeks gestation of pregnancy: Secondary | ICD-10-CM

## 2018-03-19 DIAGNOSIS — Z3490 Encounter for supervision of normal pregnancy, unspecified, unspecified trimester: Secondary | ICD-10-CM

## 2018-03-19 DIAGNOSIS — F329 Major depressive disorder, single episode, unspecified: Secondary | ICD-10-CM

## 2018-03-19 DIAGNOSIS — O99013 Anemia complicating pregnancy, third trimester: Secondary | ICD-10-CM

## 2018-03-19 DIAGNOSIS — O99343 Other mental disorders complicating pregnancy, third trimester: Secondary | ICD-10-CM

## 2018-03-19 NOTE — Progress Notes (Signed)
Routine Prenatal Care Visit  Subjective  Brittany Vaughn is a 17 y.o. G2P0 at [redacted]w[redacted]d being seen today for ongoing prenatal care.  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  ----------------------------------------------------------------------------------- Patient reports no complaints.   Contractions: Not present. Vag. Bleeding: None.  Movement: Present. Denies leaking of fluid.  ----------------------------------------------------------------------------------- The following portions of the patient's history were reviewed and updated as appropriate: allergies, current medications, past family history, past medical history, past social history, past surgical history and problem list. Problem list updated.   Objective  Blood pressure 96/60, weight 172 lb (78 kg), last menstrual period Brittany/20/2018. Pregravid weight 138 lb (62.6 kg) Total Weight Gain 34 lb (15.4 kg) Urinalysis:      Fetal Status: Fetal Heart Rate (bpm): 145 Fundal Height: 37 cm Movement: Present  Presentation: Vertex  General:  Alert, oriented and cooperative. Patient is in no acute distress.  Skin: Skin is warm and dry. No rash noted.   Cardiovascular: Normal heart rate noted  Respiratory: Normal respiratory effort, no problems with respiration noted  Abdomen: Soft, gravid, appropriate for gestational age. Pain/Pressure: Absent     Pelvic:  Cervical exam performed Dilation: 1.5 Effacement (%): 50 Station: -3  Extremities: Normal range of motion.     Mental Status: Normal mood and affect. Normal behavior. Normal judgment  and thought content.   Assessment   Brittany y.o. 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  Patient is starting a support group on 02/12/18.       Anemia in pregnancy, third trimester 02/11/2018 by Homero Fellers, MD No   Encounter for supervision of low-risk pregnancy, antepartum Brittany Vaughn/02/2018 by Gae Dry, MD No   Overview Addendum 02/11/2018  Brittany Vaughn:32 PM by Homero Fellers, MD    [facility]inic [facility] Prenatal Labs  Dating 13 week [place] Blood type: O/Positive/-- (02/07 0909)   Genetic Screen  Quad: neg  Antibody:Negative (02/07 0909)  Anatomic [place]  complete Rubella: Brittany Vaughn.67 (02/07 3762) Varicella:    GTT Early:                Third trimester: 97  RPR: Non Reactive (02/07 0909)   Rhogam Not indicated HBsAg: Negative (02/07 0909)   TDaP vaccine   01/28/18                     Flu Shot: Brittany Vaughn/2019 HIV: Non Reactive (02/07 0909)   Baby Food   Breast                             GBS:   Contraception paraguard Pap: under 21  CBB     CS/VBAC Not applicalbe   Support Person                  Term labor symptoms and general  obstetric precautions including but not limited to vaginal bleeding, contractions, leaking of fluid and fetal movement were reviewed in detail with the patient. Please refer to After Visit Summary for other counseling recommendations.   Return in about 1 week (around 03/26/2018) for Routine Prenatal Appointment.  Prentice Docker, MD, [facility], [facility] 03/19/2018 Brittany:02 AM

## 2018-03-19 NOTE — Progress Notes (Incomplete)
Routine Prenatal Care Visit  Subjective  Brittany Vaughn is a 17 y.o. G2P0 at [redacted]w[redacted]d being seen today for ongoing prenatal care.  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  ----------------------------------------------------------------------------------- Patient reports {sx:[id number]}.   Contractions: Not present. Vag. Bleeding: None.  Movement: Present. Denies leaking of fluid.  ----------------------------------------------------------------------------------- The following portions of the patient's history were reviewed and updated as appropriate: allergies, current medications, past family history, past medical history, past social history, past surgical history and problem list. Problem list updated.   Objective  Blood pressure 96/60, weight 172 lb (78 kg), last menstrual period 06/14/2017. Pregravid weight 138 lb (62.6 kg) Total Weight Gain 34 lb (15.4 kg) Urinalysis:      Fetal Status: Fetal Heart Rate (bpm): 145 Fundal Height: 37 cm Movement: Present  Presentation: Vertex  General:  Alert, oriented and cooperative. Patient is in no acute distress.  Skin: Skin is warm and dry. No rash noted.   Cardiovascular: Normal heart rate noted  Respiratory: Normal respiratory effort, no problems with respiration noted  Abdomen: Soft, gravid, appropriate for gestational age. Pain/Pressure: Absent     Pelvic:  {Blank single:19197::"Cervical exam performed","Cervical exam deferred"} Dilation: 1.5 Effacement (%): 50 Station: -3  Extremities: Normal range of  motion.     Mental Status: Normal mood and affect. Normal behavior. Normal judgment and thought content.   Assessment   17 y.o. 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  Patient is starting a support group on 02/12/18.       Anemia in pregnancy, third trimester 02/11/2018 by Homero Fellers, MD No   Encounter for supervision of low-risk pregnancy, antepartum 10/02/2017 by Gae Dry, MD No   Overview Addendum 02/11/2018  2:32 PM by Homero Fellers, MD    [facility] Prenatal Labs  Dating 13 week [place] Blood type: O/Positive/-- (02/07 0909)   Genetic Screen  Quad: neg  Antibody:Negative (02/07 0909)  Anatomic [place]  complete Rubella: 2.67 (02/07 2229) Varicella:    GTT Early:                Third trimester: 97  RPR: Non Reactive (02/07 0909)   Rhogam Not indicated HBsAg: Negative (02/07 0909)   TDaP vaccine   01/28/18                     Flu Shot: 09/2017 HIV: Non Reactive (02/07 0909)   Baby Food   Breast                             GBS:   Contraception paraguard Pap: under 21  CBB     CS/VBAC Not applicalbe   Support Person                  {Blank  single:19197::"Term","Preterm"} labor symptoms and general obstetric precautions including but not limited to vaginal bleeding, contractions, leaking of fluid and fetal movement were reviewed in detail with the patient. Please refer to After Visit Summary for other counseling recommendations.   Return in about 1 week (around 03/26/2018) for Routine Prenatal Appointment.  Prentice Docker, MD, [facility], [facility] 03/19/2018 10:02 AM

## 2018-03-26 ENCOUNTER — Encounter: Payer: Self-pay | Admitting: Advanced Practice Midwife

## 2018-03-26 ENCOUNTER — Ambulatory Visit (INDEPENDENT_AMBULATORY_CARE_PROVIDER_SITE_OTHER): Payer: Medicaid Other | Admitting: Advanced Practice Midwife

## 2018-03-26 VITALS — BP 92/60 | Wt 166.0 lb

## 2018-03-26 DIAGNOSIS — O99343 Other mental disorders complicating pregnancy, third trimester: Secondary | ICD-10-CM

## 2018-03-26 DIAGNOSIS — F339 Major depressive disorder, recurrent, unspecified: Secondary | ICD-10-CM

## 2018-03-26 DIAGNOSIS — O99013 Anemia complicating pregnancy, third trimester: Secondary | ICD-10-CM

## 2018-03-26 DIAGNOSIS — Z3A38 38 weeks gestation of pregnancy: Secondary | ICD-10-CM

## 2018-03-26 DIAGNOSIS — D469 Myelodysplastic syndrome, unspecified: Secondary | ICD-10-CM

## 2018-03-26 NOTE — Progress Notes (Signed)
Routine Prenatal Care Visit  Subjective  Brittany Vaughn is a 17 y.o. G2P0 at [redacted]w[redacted]d being seen today for ongoing prenatal care.  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  ----------------------------------------------------------------------------------- Patient reports having a cold. Reviewed safe otc meds and other comfort measures.   Contractions: Irregular. Vag. Bleeding: None.  Movement: Present. Denies leaking of fluid.  ----------------------------------------------------------------------------------- The following portions of the patient's history were reviewed and updated as appropriate: allergies, current medications, past family history, past medical history, past social history, past surgical history and problem list. Problem list updated.   Objective  Blood pressure 92/60, weight 166 lb (75.3 kg), last menstrual period 06/14/2017. Pregravid weight 138 lb (62.6 kg) Total Weight Gain 28 lb (12.7 kg) Urinalysis: Urine Protein: Negative Urine Glucose: Negative  Fetal Status: Fetal Heart Rate (bpm): 140 Fundal Height: 37 cm Movement: Present  Presentation: Vertex  General:  Alert, oriented and cooperative. Patient is in no acute distress.  Skin: Skin is warm and dry. No rash noted.   Cardiovascular: Normal heart rate noted  Respiratory: Normal respiratory effort, no problems with respiration noted  Abdomen: Soft, gravid, appropriate for gestational age. Pain/Pressure: Present     Pelvic:  Cervical exam performed Dilation: 1.5 Effacement (%): 60 Station: -2  Extremities:  Normal range of motion.  Edema: None  Mental Status: Normal mood and affect. Normal behavior. Normal judgment and thought content.   Assessment   17 y.o. 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  Patient is starting a support group on 02/12/18.       Anemia in pregnancy, third trimester 02/11/2018 by Homero Fellers, MD No   Encounter for supervision of low-risk pregnancy, antepartum 10/02/2017 by Gae Dry, MD No   Overview Addendum 02/11/2018  2:32 PM by Homero Fellers, MD    [facility] Prenatal Labs  Dating 13 week [place] Blood type: O/Positive/-- (02/07 0909)   Genetic Screen  Quad: neg  Antibody:Negative (02/07 0909)  Anatomic [place]  complete Rubella: 2.67 (02/07 0277) Varicella:    GTT Early:                Third trimester: 97  RPR: Non Reactive (02/07 0909)   Rhogam Not indicated HBsAg: Negative (02/07 0909)   TDaP vaccine   01/28/18                     Flu Shot: 09/2017 HIV: Non Reactive (02/07 0909)   Baby Food   Breast                             GBS:   Contraception paraguard Pap: under 21  CBB     CS/VBAC Not applicalbe   Support Person  Term labor symptoms and general obstetric precautions including but not limited to vaginal bleeding, contractions, leaking of fluid and fetal movement were reviewed in detail with the patient. Please refer to After Visit Summary for other counseling recommendations.   Return in about 1 week (around 04/02/2018) for rob.  Rod Can, CNM 03/26/2018 10:54 AM

## 2018-03-26 NOTE — Patient Instructions (Signed)
Vaginal Delivery Vaginal delivery means that you will give birth by pushing your baby out of your birth canal (vagina). A team of health care providers will help you before, during, and after vaginal delivery. Birth experiences are unique for every woman and every pregnancy, and birth experiences vary depending on where you choose to give birth. What should I do to prepare for my baby's birth? Before your baby is born, it is important to talk with your health care provider about:  Your labor and delivery preferences. These may include: ? Medicines that you may be given. ? How you will manage your pain. This might include non-medical pain relief techniques or injectable pain relief such as epidural analgesia. ? How you and your baby will be monitored during labor and delivery. ? Who may be in the labor and delivery room with you. ? Your feelings about surgical delivery of your baby (cesarean delivery, or C-section) if this becomes necessary. ? Your feelings about receiving donated blood through an IV tube (blood transfusion) if this becomes necessary.  Whether you are able: ? To take pictures or videos of the birth. ? To eat during labor and delivery. ? To move around, walk, or change positions during labor and delivery.  What to expect after your baby is born, such as: ? Whether delayed umbilical cord clamping and cutting is offered. ? Who will care for your baby right after birth. ? Medicines or tests that may be recommended for your baby. ? Whether breastfeeding is supported in your hospital or birth center. ? How long you will be in the hospital or birth center.  How any medical conditions you have may affect your baby or your labor and delivery experience.  To prepare for your baby's birth, you should also:  Attend all of your health care visits before delivery (prenatal visits) as recommended by your health care provider. This is important.  Prepare your home for your baby's  arrival. Make sure that you have: ? Diapers. ? Baby clothing. ? Feeding equipment. ? Safe sleeping arrangements for you and your baby.  Install a car seat in your vehicle. Have your car seat checked by a certified car seat installer to make sure that it is installed safely.  Think about who will help you with your new baby at home for at least the first several weeks after delivery.  What can I expect when I arrive at the birth center or hospital? Once you are in labor and have been admitted into the hospital or birth center, your health care provider may:  Review your pregnancy history and any concerns you have.  Insert an IV tube into one of your veins. This is used to give you fluids and medicines.  Check your blood pressure, pulse, temperature, and heart rate (vital signs).  Check whether your bag of water (amniotic sac) has broken (ruptured).  Talk with you about your birth plan and discuss pain control options.  Monitoring Your health care provider may monitor your contractions (uterine monitoring) and your baby's heart rate (fetal monitoring). You may need to be monitored:  Often, but not continuously (intermittently).  All the time or for long periods at a time (continuously). Continuous monitoring may be needed if: ? You are taking certain medicines, such as medicine to relieve pain or make your contractions stronger. ? You have pregnancy or labor complications.  Monitoring may be done by:  Placing a special stethoscope or a handheld monitoring device on your abdomen to   check your baby's heartbeat, and feeling your abdomen for contractions. This method of monitoring does not continuously record your baby's heartbeat or your contractions.  Placing monitors on your abdomen (external monitors) to record your baby's heartbeat and the frequency and length of contractions. You may not have to wear external monitors all the time.  Placing monitors inside of your uterus  (internal monitors) to record your baby's heartbeat and the frequency, length, and strength of your contractions. ? Your health care provider may use internal monitors if he or she needs more information about the strength of your contractions or your baby's heart rate. ? Internal monitors are put in place by passing a thin, flexible wire through your vagina and into your uterus. Depending on the type of monitor, it may remain in your uterus or on your baby's head until birth. ? Your health care provider will discuss the benefits and risks of internal monitoring with you and will ask for your permission before inserting the monitors.  Telemetry. This is a type of continuous monitoring that can be done with external or internal monitors. Instead of having to stay in bed, you are able to move around during telemetry. Ask your health care provider if telemetry is an option for you.  Physical exam Your health care provider may perform a physical exam. This may include:  Checking whether your baby is positioned: ? With the head toward your vagina (head-down). This is most common. ? With the head toward the top of your uterus (head-up or breech). If your baby is in a breech position, your health care provider may try to turn your baby to a head-down position so you can deliver vaginally. If it does not seem that your baby can be born vaginally, your provider may recommend surgery to deliver your baby. In rare cases, you may be able to deliver vaginally if your baby is head-up (breech delivery). ? Lying sideways (transverse). Babies that are lying sideways cannot be delivered vaginally.  Checking your cervix to determine: ? Whether it is thinning out (effacing). ? Whether it is opening up (dilating). ? How low your baby has moved into your birth canal.  What are the three stages of labor and delivery?  Normal labor and delivery is divided into the following three stages: Stage 1  Stage 1 is the  longest stage of labor, and it can last for hours or days. Stage 1 includes: ? Early labor. This is when contractions may be irregular, or regular and mild. Generally, early labor contractions are more than 10 minutes apart. ? Active labor. This is when contractions get longer, more regular, more frequent, and more intense. ? The transition phase. This is when contractions happen very close together, are very intense, and may last longer than during any other part of labor.  Contractions generally feel mild, infrequent, and irregular at first. They get stronger, more frequent (about every 2-3 minutes), and more regular as you progress from early labor through active labor and transition.  Many women progress through stage 1 naturally, but you may need help to continue making progress. If this happens, your health care provider may talk with you about: ? Rupturing your amniotic sac if it has not ruptured yet. ? Giving you medicine to help make your contractions stronger and more frequent.  Stage 1 ends when your cervix is completely dilated to 4 inches (10 cm) and completely effaced. This happens at the end of the transition phase. Stage 2  Once   your cervix is completely effaced and dilated to 4 inches (10 cm), you may start to feel an urge to push. It is common for the body to naturally take a rest before feeling the urge to push, especially if you received an epidural or certain other pain medicines. This rest period may last for up to 1-2 hours, depending on your unique labor experience.  During stage 2, contractions are generally less painful, because pushing helps relieve contraction pain. Instead of contraction pain, you may feel stretching and burning pain, especially when the widest part of your baby's head passes through the vaginal opening (crowning).  Your health care provider will closely monitor your pushing progress and your baby's progress through the vagina during stage 2.  Your  health care provider may massage the area of skin between your vaginal opening and anus (perineum) or apply warm compresses to your perineum. This helps it stretch as the baby's head starts to crown, which can help prevent perineal tearing. ? In some cases, an incision may be made in your perineum (episiotomy) to allow the baby to pass through the vaginal opening. An episiotomy helps to make the opening of the vagina larger to allow more room for the baby to fit through.  It is very important to breathe and focus so your health care provider can control the delivery of your baby's head. Your health care provider may have you decrease the intensity of your pushing, to help prevent perineal tearing.  After delivery of your baby's head, the shoulders and the rest of the body generally deliver very quickly and without difficulty.  Once your baby is delivered, the umbilical cord may be cut right away, or this may be delayed for 1-2 minutes, depending on your baby's health. This may vary among health care providers, hospitals, and birth centers.  If you and your baby are healthy enough, your baby may be placed on your chest or abdomen to help maintain the baby's temperature and to help you bond with each other. Some mothers and babies start breastfeeding at this time. Your health care team will dry your baby and help keep your baby warm during this time.  Your baby may need immediate care if he or she: ? Showed signs of distress during labor. ? Has a medical condition. ? Was born too early (prematurely). ? Had a bowel movement before birth (meconium). ? Shows signs of difficulty transitioning from being inside the uterus to being outside of the uterus. If you are planning to breastfeed, your health care team will help you begin a feeding. Stage 3  The third stage of labor starts immediately after the birth of your baby and ends after you deliver the placenta. The placenta is an organ that develops  during pregnancy to provide oxygen and nutrients to your baby in the womb.  Delivering the placenta may require some pushing, and you may have mild contractions. Breastfeeding can stimulate contractions to help you deliver the placenta.  After the placenta is delivered, your uterus should tighten (contract) and become firm. This helps to stop bleeding in your uterus. To help your uterus contract and to control bleeding, your health care provider may: ? Give you medicine by injection, through an IV tube, by mouth, or through your rectum (rectally). ? Massage your abdomen or perform a vaginal exam to remove any blood clots that are left in your uterus. ? Empty your bladder by placing a thin, flexible tube (catheter) into your bladder. ? Encourage   you to breastfeed your baby. After labor is over, you and your baby will be monitored closely to ensure that you are both healthy until you are ready to go home. Your health care team will teach you how to care for yourself and your baby. This information is not intended to replace advice given to you by your health care provider. Make sure you discuss any questions you have with your health care provider. Document Released: 05/21/2008 Document Revised: 03/01/2016 Document Reviewed: 08/27/2015 Elsevier Interactive Patient Education  2018 [employer]  

## 2018-04-02 ENCOUNTER — Encounter: Payer: Self-pay | Admitting: Obstetrics and Gynecology

## 2018-04-02 ENCOUNTER — Ambulatory Visit (INDEPENDENT_AMBULATORY_CARE_PROVIDER_SITE_OTHER): Payer: Medicaid Other | Admitting: Obstetrics and Gynecology

## 2018-04-02 VITALS — BP 110/64 | Wt 168.0 lb

## 2018-04-02 DIAGNOSIS — O99323 Drug use complicating pregnancy, third trimester: Secondary | ICD-10-CM

## 2018-04-02 DIAGNOSIS — Z3490 Encounter for supervision of normal pregnancy, unspecified, unspecified trimester: Secondary | ICD-10-CM

## 2018-04-02 DIAGNOSIS — Z3402 Encounter for supervision of normal first pregnancy, second trimester: Secondary | ICD-10-CM

## 2018-04-02 DIAGNOSIS — O99013 Anemia complicating pregnancy, third trimester: Secondary | ICD-10-CM

## 2018-04-02 DIAGNOSIS — Z3A39 39 weeks gestation of pregnancy: Secondary | ICD-10-CM

## 2018-04-02 DIAGNOSIS — F1290 Cannabis use, unspecified, uncomplicated: Secondary | ICD-10-CM

## 2018-04-02 DIAGNOSIS — D649 Anemia, unspecified: Secondary | ICD-10-CM

## 2018-04-02 NOTE — Progress Notes (Signed)
ROB C/o some thinner discharge Desires cervical check

## 2018-04-02 NOTE — Progress Notes (Signed)
Routine Prenatal Care Visit  Subjective  Brittany Vaughn is a 17 y.o. G2P0 at [redacted]w[redacted]d being seen today for ongoing prenatal care.  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  ----------------------------------------------------------------------------------- Patient reports no complaints.   Contractions: Irregular. Vag. Bleeding: None.  Movement: Present. Denies leaking of fluid.  ----------------------------------------------------------------------------------- The following portions of the patient's history were reviewed and updated as appropriate: allergies, current medications, past family history, past medical history, past social history, past surgical history and problem list. Problem list updated.   Objective  Blood pressure 110/64, weight 168 lb (76.2 kg), last menstrual period 06/14/2017. Pregravid weight 138 lb (62.6 kg) Total Weight Gain 30 lb (13.6 kg) Urinalysis:     protein and glucose negative.   Fetal Status: Fetal Heart Rate (bpm): 133 Fundal Height: 39 cm Movement: Present  Presentation: Vertex  General:  Alert, oriented and cooperative. Patient is in no acute distress.  Skin: Skin is warm and dry. No rash noted.   Cardiovascular: Normal heart rate noted  Respiratory: Normal respiratory effort, no problems with respiration noted  Abdomen: Soft, gravid, appropriate for gestational age. Pain/Pressure: Absent     Pelvic:  Cervical exam performed Dilation: 2 Effacement (%): 70 Station: -2  Extremities: Normal range of motion.     ental Status: Normal mood and affect.  Normal behavior. Normal judgment and thought content.     Assessment   17 y.o. 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  Patient is starting a support group on 02/12/18.       Anemia in pregnancy, third trimester 02/11/2018 by Homero Fellers, MD No   Encounter for supervision of low-risk pregnancy, antepartum 10/02/2017 by Gae Dry, MD No   Overview Addendum 02/11/2018  2:32 PM by Homero Fellers, MD    [facility] Prenatal Labs  Dating 13 week [place] Blood type: O/Positive/-- (02/07 0909)   Genetic Screen  Quad: neg  Antibody:Negative (02/07 0909)  Anatomic [place]  complete Rubella: 2.67 (02/07 7829) Varicella:    GTT Early:                Third trimester: 97  RPR: Non Reactive (02/07 0909)   Rhogam Not indicated HBsAg: Negative (02/07 0909)   TDaP vaccine   01/28/18                     Flu Shot: 09/2017 HIV: Non Reactive (02/07 0909)   Baby Food   Breast                             GBS:   Contraception paraguard Pap: under 21  CBB     CS/VBAC Not applicalbe   Support Person  Gestational age appropriate obstetric precautions including but not limited to vaginal bleeding, contractions, leaking of fluid and fetal movement were reviewed in detail with the patient.    Urine negative for protein and glucose Membranes swept Return in about 1 week (around 04/09/2018) for ROB.  Adrian Prows MD [facility] 04/02/18 8:28 AM

## 2018-04-02 NOTE — Patient Instructions (Signed)
Vaginal Delivery Vaginal delivery means that you will give birth by pushing your baby out of your birth canal (vagina). A team of health care providers will help you before, during, and after vaginal delivery. Birth experiences are unique for every woman and every pregnancy, and birth experiences vary depending on where you choose to give birth. What should I do to prepare for my baby's birth? Before your baby is born, it is important to talk with your health care provider about:  Your labor and delivery preferences. These may include: ? Medicines that you may be given. ? How you will manage your pain. This might include non-medical pain relief techniques or injectable pain relief such as epidural analgesia. ? How you and your baby will be monitored during labor and delivery. ? Who may be in the labor and delivery room with you. ? Your feelings about surgical delivery of your baby (cesarean delivery, or C-section) if this becomes necessary. ? Your feelings about receiving donated blood through an IV tube (blood transfusion) if this becomes necessary.  Whether you are able: ? To take pictures or videos of the birth. ? To eat during labor and delivery. ? To move around, walk, or change positions during labor and delivery.  What to expect after your baby is born, such as: ? Whether delayed umbilical cord clamping and cutting is offered. ? Who will care for your baby right after birth. ? Medicines or tests that may be recommended for your baby. ? Whether breastfeeding is supported in your hospital or birth center. ? How long you will be in the hospital or birth center.  How any medical conditions you have may affect your baby or your labor and delivery experience.  To prepare for your baby's birth, you should also:  Attend all of your health care visits before delivery (prenatal visits) as recommended by your health care provider. This is important.  Prepare your home for your baby's  arrival. Make sure that you have: ? Diapers. ? Baby clothing. ? Feeding equipment. ? Safe sleeping arrangements for you and your baby.  Install a car seat in your vehicle. Have your car seat checked by a certified car seat installer to make sure that it is installed safely.  Think about who will help you with your new baby at home for at least the first several weeks after delivery.  What can I expect when I arrive at the birth center or hospital? Once you are in labor and have been admitted into the hospital or birth center, your health care provider may:  Review your pregnancy history and any concerns you have.  Insert an IV tube into one of your veins. This is used to give you fluids and medicines.  Check your blood pressure, pulse, temperature, and heart rate (vital signs).  Check whether your bag of water (amniotic sac) has broken (ruptured).  Talk with you about your birth plan and discuss pain control options.  Monitoring Your health care provider may monitor your contractions (uterine monitoring) and your baby's heart rate (fetal monitoring). You may need to be monitored:  Often, but not continuously (intermittently).  All the time or for long periods at a time (continuously). Continuous monitoring may be needed if: ? You are taking certain medicines, such as medicine to relieve pain or make your contractions stronger. ? You have pregnancy or labor complications.  Monitoring may be done by:  Placing a special stethoscope or a handheld monitoring device on your abdomen to   check your baby's heartbeat, and feeling your abdomen for contractions. This method of monitoring does not continuously record your baby's heartbeat or your contractions.  Placing monitors on your abdomen (external monitors) to record your baby's heartbeat and the frequency and length of contractions. You may not have to wear external monitors all the time.  Placing monitors inside of your uterus  (internal monitors) to record your baby's heartbeat and the frequency, length, and strength of your contractions. ? Your health care provider may use internal monitors if he or she needs more information about the strength of your contractions or your baby's heart rate. ? Internal monitors are put in place by passing a thin, flexible wire through your vagina and into your uterus. Depending on the type of monitor, it may remain in your uterus or on your baby's head until birth. ? Your health care provider will discuss the benefits and risks of internal monitoring with you and will ask for your permission before inserting the monitors.  Telemetry. This is a type of continuous monitoring that can be done with external or internal monitors. Instead of having to stay in bed, you are able to move around during telemetry. Ask your health care provider if telemetry is an option for you.  Physical exam Your health care provider may perform a physical exam. This may include:  Checking whether your baby is positioned: ? With the head toward your vagina (head-down). This is most common. ? With the head toward the top of your uterus (head-up or breech). If your baby is in a breech position, your health care provider may try to turn your baby to a head-down position so you can deliver vaginally. If it does not seem that your baby can be born vaginally, your provider may recommend surgery to deliver your baby. In rare cases, you may be able to deliver vaginally if your baby is head-up (breech delivery). ? Lying sideways (transverse). Babies that are lying sideways cannot be delivered vaginally.  Checking your cervix to determine: ? Whether it is thinning out (effacing). ? Whether it is opening up (dilating). ? How low your baby has moved into your birth canal.  What are the three stages of labor and delivery?  Normal labor and delivery is divided into the following three stages: Stage 1  Stage 1 is the  longest stage of labor, and it can last for hours or days. Stage 1 includes: ? Early labor. This is when contractions may be irregular, or regular and mild. Generally, early labor contractions are more than 10 minutes apart. ? Active labor. This is when contractions get longer, more regular, more frequent, and more intense. ? The transition phase. This is when contractions happen very close together, are very intense, and may last longer than during any other part of labor.  Contractions generally feel mild, infrequent, and irregular at first. They get stronger, more frequent (about every 2-3 minutes), and more regular as you progress from early labor through active labor and transition.  Many women progress through stage 1 naturally, but you may need help to continue making progress. If this happens, your health care provider may talk with you about: ? Rupturing your amniotic sac if it has not ruptured yet. ? Giving you medicine to help make your contractions stronger and more frequent.  Stage 1 ends when your cervix is completely dilated to 4 inches (10 cm) and completely effaced. This happens at the end of the transition phase. Stage 2  Once   your cervix is completely effaced and dilated to 4 inches (10 cm), you may start to feel an urge to push. It is common for the body to naturally take a rest before feeling the urge to push, especially if you received an epidural or certain other pain medicines. This rest period may last for up to 1-2 hours, depending on your unique labor experience.  During stage 2, contractions are generally less painful, because pushing helps relieve contraction pain. Instead of contraction pain, you may feel stretching and burning pain, especially when the widest part of your baby's head passes through the vaginal opening (crowning).  Your health care provider will closely monitor your pushing progress and your baby's progress through the vagina during stage 2.  Your  health care provider may massage the area of skin between your vaginal opening and anus (perineum) or apply warm compresses to your perineum. This helps it stretch as the baby's head starts to crown, which can help prevent perineal tearing. ? In some cases, an incision may be made in your perineum (episiotomy) to allow the baby to pass through the vaginal opening. An episiotomy helps to make the opening of the vagina larger to allow more room for the baby to fit through.  It is very important to breathe and focus so your health care provider can control the delivery of your baby's head. Your health care provider may have you decrease the intensity of your pushing, to help prevent perineal tearing.  After delivery of your baby's head, the shoulders and the rest of the body generally deliver very quickly and without difficulty.  Once your baby is delivered, the umbilical cord may be cut right away, or this may be delayed for 1-2 minutes, depending on your baby's health. This may vary among health care providers, hospitals, and birth centers.  If you and your baby are healthy enough, your baby may be placed on your chest or abdomen to help maintain the baby's temperature and to help you bond with each other. Some mothers and babies start breastfeeding at this time. Your health care team will dry your baby and help keep your baby warm during this time.  Your baby may need immediate care if he or she: ? Showed signs of distress during labor. ? Has a medical condition. ? Was born too early (prematurely). ? Had a bowel movement before birth (meconium). ? Shows signs of difficulty transitioning from being inside the uterus to being outside of the uterus. If you are planning to breastfeed, your health care team will help you begin a feeding. Stage 3  The third stage of labor starts immediately after the birth of your baby and ends after you deliver the placenta. The placenta is an organ that develops  during pregnancy to provide oxygen and nutrients to your baby in the womb.  Delivering the placenta may require some pushing, and you may have mild contractions. Breastfeeding can stimulate contractions to help you deliver the placenta.  After the placenta is delivered, your uterus should tighten (contract) and become firm. This helps to stop bleeding in your uterus. To help your uterus contract and to control bleeding, your health care provider may: ? Give you medicine by injection, through an IV tube, by mouth, or through your rectum (rectally). ? Massage your abdomen or perform a vaginal exam to remove any blood clots that are left in your uterus. ? Empty your bladder by placing a thin, flexible tube (catheter) into your bladder. ? Encourage   you to breastfeed your baby. After labor is over, you and your baby will be monitored closely to ensure that you are both healthy until you are ready to go home. Your health care team will teach you how to care for yourself and your baby. This information is not intended to replace advice given to you by your health care provider. Make sure you discuss any questions you have with your health care provider. Document Released: 05/21/2008 Document Revised: 03/01/2016 Document Reviewed: 08/27/2015 Elsevier Interactive Patient Education  2018 [place]. Pain Relief During Labor and Delivery Many things can cause pain during labor and delivery, including:  Pressure on bones and ligaments due to the baby moving through the pelvis.  Stretching of tissues due to the baby moving through the birth canal.  Muscle tension due to anxiety or nervousness.  The uterus tightening (contracting) and relaxing to help move the baby.  There are many ways to deal with the pain of labor and delivery. They include:  Taking prenatal classes. Taking these classes helps you know what to expect during your baby's birth. What you learn will increase your confidence and  decrease your anxiety.  Practicing relaxation techniques or doing relaxing activities, such as: ? Focused breathing. ? Meditation. ? Visualization. ? Aroma therapy. ? Listening to your favorite music. ? Hypnosis.  Taking a warm shower or bath (hydrotherapy). This may: ? Provide comfort and relaxation. ? Lessen your perception of pain. ? Decrease the amount of pain medicine needed. ? Decrease the length of labor.  Getting a massage or counterpressure on your back.  Applying warm packs or ice packs.  Changing positions often, moving around, or using a birthing ball.  Getting: ? Pain medicine through an IV or injection into a muscle. ? Pain medicine inserted into your spinal column. ? Injections of sterile water just under the skin on your lower back (intradermal injections). ? Laughing gas (nitrous oxide).  Discuss your pain control options with your health care provider during your prenatal visits. Explore the options offered by your hospital or birth center. What kinds of medicine are available? There are two kinds of medicines that can be used to relieve pain during labor and delivery:  Analgesics. These medicines decrease pain without causing you to lose feeling or the ability to move your muscles.  Anesthetics. These medicines block feeling in the body and can decrease your ability to move freely.  Both of these kinds of medicine can cause minor side effects, such as nausea, trouble concentrating, and sleepiness. They can also decrease the baby's heart rate before birth and affect the baby's breathing rate after birth. For this reason, health care providers are careful about when and how much medicine is given. What are specific medicines and procedures that provide pain relief? Local Anesthetics Local anesthetics are used to numb a small area of the body. They may be used along with another kind of anesthetic or used to numb the nerves of the vagina, cervix, and perineum  during the second stage of labor. General Anesthetics General anesthetics cause you to lose consciousness so you do not feel pain. They are usually only used for an emergency cesarean delivery. General anesthetics are given through an IV tube and a mask. Pudendal Block A pudendal block is a form of local anesthetic. It may be used to relieve the pain associated with pushing or stretching of the perineum at the time of delivery or to further numb the perineum. A pudendal block is done  by injecting numbing medicine through the vaginal wall into a nerve in the pelvis. Epidural Analgesia Epidural analgesia is given through a flexible IV catheter that is inserted into the lower back. Numbing medicine is delivered continuously to the area near your spinal column nerves (epidural space). After having this type of analgesia, you may be able to move your legs but you most likely will not be able to walk. Depending on the amount of medicine given, you may lose all feeling in the lower half of your body, or you may retain some level of sensation, including the urge to push. Epidural analgesia can be used to provide pain relief for a vaginal birth. Spinal Block A spinal block is similar to epidural analgesia, but the medicine is injected into the spinal fluid instead of the epidural space. A spinal block is only given once. It starts to relieve pain quickly, but the pain relief lasts only 1-6 hours. Spinal blocks can be used for cesarean deliveries. Combined Spinal-Epidural (CSE) Block A CSE block combines the effects of a spinal block and epidural analgesia. The spinal block works quickly to block all pain. The epidural analgesia provides continuous pain relief, even after the effects of the spinal block have worn off. This information is not intended to replace advice given to you by your health care provider. Make sure you discuss any questions you have with your health care provider. Document Released:  11/28/2008 Document Revised: 01/19/2016 Document Reviewed: 01/03/2016 Elsevier Interactive Patient Education  [employer].

## 2018-04-03 ENCOUNTER — Inpatient Hospital Stay
Admission: EM | Admit: 2018-04-03 | Discharge: 2018-04-05 | DRG: 806 | Disposition: A | Discharge status: Home / Self Care | Payer: Medicaid Other | Attending: Maternal Newborn | Admitting: Maternal Newborn

## 2018-04-03 ENCOUNTER — Observation Stay
Admission: EM | Admit: 2018-04-03 | Discharge: 2018-04-03 | Disposition: A | Discharge status: Home / Self Care | Payer: Medicaid Other | Source: Home / Self Care | Admitting: Obstetrics and Gynecology

## 2018-04-03 ENCOUNTER — Other Ambulatory Visit: Payer: Self-pay

## 2018-04-03 DIAGNOSIS — Z3A39 39 weeks gestation of pregnancy: Secondary | ICD-10-CM

## 2018-04-03 DIAGNOSIS — D62 Acute posthemorrhagic anemia: Secondary | ICD-10-CM | POA: Diagnosis not present

## 2018-04-03 DIAGNOSIS — Z3402 Encounter for supervision of normal first pregnancy, second trimester: Secondary | ICD-10-CM

## 2018-04-03 DIAGNOSIS — F1290 Cannabis use, unspecified, uncomplicated: Secondary | ICD-10-CM | POA: Diagnosis present

## 2018-04-03 DIAGNOSIS — O99013 Anemia complicating pregnancy, third trimester: Secondary | ICD-10-CM

## 2018-04-03 DIAGNOSIS — O9081 Anemia of the puerperium: Secondary | ICD-10-CM | POA: Diagnosis not present

## 2018-04-03 DIAGNOSIS — O99343 Other mental disorders complicating pregnancy, third trimester: Principal | ICD-10-CM

## 2018-04-03 DIAGNOSIS — O99324 Drug use complicating childbirth: Principal | ICD-10-CM | POA: Diagnosis present

## 2018-04-03 DIAGNOSIS — F32A Other mental disorders complicating pregnancy, third trimester: Secondary | ICD-10-CM | POA: Diagnosis present

## 2018-04-03 DIAGNOSIS — Z3490 Encounter for supervision of normal pregnancy, unspecified, unspecified trimester: Secondary | ICD-10-CM

## 2018-04-03 DIAGNOSIS — Z3483 Encounter for supervision of other normal pregnancy, third trimester: Secondary | ICD-10-CM | POA: Diagnosis present

## 2018-04-03 DIAGNOSIS — O4202 Full-term premature rupture of membranes, onset of labor within 24 hours of rupture: Secondary | ICD-10-CM

## 2018-04-03 DIAGNOSIS — F329 Major depressive disorder, single episode, unspecified: Secondary | ICD-10-CM

## 2018-04-03 LAB — TYPE AND SCREEN
ABO/RH(D): O POS
Antibody Screen: NEGATIVE

## 2018-04-03 LAB — CBC
HCT: 36 % (ref 35.0–47.0)
Hemoglobin: 12.3 g/dL (ref 12.0–16.0)
MCH: 29.6 pg (ref 26.0–34.0)
MCHC: 34.1 g/dL (ref 32.0–36.0)
MCV: 86.7 fL (ref 80.0–100.0)
Platelets: 243 10*3/uL (ref 150–440)
RBC: 4.15 MIL/uL (ref 3.80–5.20)
RDW: 13.9 % (ref 11.5–14.5)
WBC: 30.7 10*3/uL — ABNORMAL HIGH (ref 3.6–11.0)

## 2018-04-03 LAB — CHLAMYDIA/NGC RT PCR (ARMC ONLY)
Chlamydia Tr: NOT DETECTED
N gonorrhoeae: NOT DETECTED

## 2018-04-03 LAB — URINE DRUG SCREEN, QUALITATIVE (ARMC ONLY)
Amphetamines, Ur Screen: NOT DETECTED
Barbiturates, Ur Screen: NOT DETECTED
COCAINE METABOLITE, UR ~~LOC~~: NOT DETECTED
Cannabinoid 50 Ng, Ur ~~LOC~~: NOT DETECTED
MDMA (ECSTASY) UR SCREEN: NOT DETECTED
Methadone Scn, Ur: NOT DETECTED
OPIATE, UR SCREEN: NOT DETECTED
PHENCYCLIDINE (PCP) UR S: NOT DETECTED
Tricyclic, Ur Screen: NOT DETECTED

## 2018-04-03 MED ORDER — DIPHENHYDRAMINE HCL 25 MG PO CAPS: 25.0000 mg | ORAL_CAPSULE | Freq: Four times a day (QID) | ORAL | Status: DC | PRN

## 2018-04-03 MED ORDER — PRENATAL MULTIVITAMIN CH
1.0000 | ORAL_TABLET | Freq: Every day | ORAL | Status: DC
  Administered 2018-04-04 – 2018-04-05 (×2): 1 via ORAL
  Filled 2018-04-03 (×2): qty 1

## 2018-04-03 MED ORDER — OXYTOCIN BOLUS FROM INFUSION
500.0000 mL | Freq: Once | INTRAVENOUS | Status: AC
  Administered 2018-04-03: 500 mL via INTRAVENOUS

## 2018-04-03 MED ORDER — SENNOSIDES-DOCUSATE SODIUM 8.6-50 MG PO TABS
2.0000 | ORAL_TABLET | ORAL | Status: DC
  Administered 2018-04-04 (×2): 2 via ORAL
  Filled 2018-04-03 (×2): qty 2

## 2018-04-03 MED ORDER — BENZOCAINE-MENTHOL 20-0.5 % EX AERO
1.0000 "application " | INHALATION_SPRAY | CUTANEOUS | Status: DC | PRN
  Filled 2018-04-03: qty 56

## 2018-04-03 MED ORDER — AMMONIA AROMATIC IN INHA
RESPIRATORY_TRACT | Status: AC
  Filled 2018-04-03: qty 10

## 2018-04-03 MED ORDER — OXYCODONE HCL 5 MG PO TABS: 10.0000 mg | ORAL_TABLET | ORAL | Status: DC | PRN

## 2018-04-03 MED ORDER — OXYCODONE-ACETAMINOPHEN 5-325 MG PO TABS: 2.0000 | ORAL_TABLET | ORAL | Status: DC | PRN

## 2018-04-03 MED ORDER — DIBUCAINE 1 % RE OINT: 1.0000 "application " | TOPICAL_OINTMENT | RECTAL | Status: DC | PRN

## 2018-04-03 MED ORDER — IBUPROFEN 600 MG PO TABS
600.0000 mg | ORAL_TABLET | Freq: Four times a day (QID) | ORAL | Status: DC
  Administered 2018-04-03 – 2018-04-05 (×7): 600 mg via ORAL
  Filled 2018-04-03 (×8): qty 1

## 2018-04-03 MED ORDER — SIMETHICONE 80 MG PO CHEW: 80.0000 mg | CHEWABLE_TABLET | ORAL | Status: DC | PRN

## 2018-04-03 MED ORDER — ONDANSETRON HCL 4 MG PO TABS: 4.0000 mg | ORAL_TABLET | ORAL | Status: DC | PRN

## 2018-04-03 MED ORDER — COCONUT OIL OIL
1.0000 "application " | TOPICAL_OIL | Status: DC | PRN
  Filled 2018-04-03: qty 120

## 2018-04-03 MED ORDER — OXYCODONE HCL 5 MG PO TABS: 5.0000 mg | ORAL_TABLET | ORAL | Status: DC | PRN

## 2018-04-03 MED ORDER — ONDANSETRON HCL 4 MG/2ML IJ SOLN: 4.0000 mg | INTRAMUSCULAR | Status: DC | PRN

## 2018-04-03 MED ORDER — MISOPROSTOL 200 MCG PO TABS
ORAL_TABLET | ORAL | Status: AC
  Filled 2018-04-03: qty 4

## 2018-04-03 MED ORDER — OXYTOCIN 40 UNITS IN LACTATED RINGERS INFUSION - SIMPLE MED
INTRAVENOUS | Status: AC
  Administered 2018-04-03: 500 mL via INTRAVENOUS
  Filled 2018-04-03: qty 1000

## 2018-04-03 MED ORDER — OXYTOCIN 10 UNIT/ML IJ SOLN
INTRAMUSCULAR | Status: AC
  Filled 2018-04-03: qty 2

## 2018-04-03 MED ORDER — LIDOCAINE HCL (PF) 1 % IJ SOLN: 30.0000 mL | INTRAMUSCULAR | Status: DC | PRN

## 2018-04-03 MED ORDER — OXYTOCIN 40 UNITS IN LACTATED RINGERS INFUSION - SIMPLE MED: 2.5000 [IU]/h | INTRAVENOUS | Status: DC

## 2018-04-03 MED ORDER — LACTATED RINGERS IV SOLN: 500.0000 mL | INTRAVENOUS | Status: DC | PRN

## 2018-04-03 MED ORDER — OXYCODONE-ACETAMINOPHEN 5-325 MG PO TABS: 1.0000 | ORAL_TABLET | ORAL | Status: DC | PRN

## 2018-04-03 MED ORDER — LIDOCAINE HCL (PF) 1 % IJ SOLN
INTRAMUSCULAR | Status: AC
  Filled 2018-04-03: qty 30

## 2018-04-03 MED ORDER — ACETAMINOPHEN 325 MG PO TABS: 650.0000 mg | ORAL_TABLET | ORAL | Status: DC | PRN

## 2018-04-03 MED ORDER — LACTATED RINGERS IV SOLN
INTRAVENOUS | Status: DC
  Administered 2018-04-03: 1000 mL via INTRAVENOUS
  Administered 2018-04-03: 13:00:00 via INTRAVENOUS

## 2018-04-03 MED ORDER — ONDANSETRON HCL 4 MG/2ML IJ SOLN: 4.0000 mg | Freq: Four times a day (QID) | INTRAMUSCULAR | Status: DC | PRN

## 2018-04-03 MED ORDER — WITCH HAZEL-GLYCERIN EX PADS: 1.0000 "application " | MEDICATED_PAD | CUTANEOUS | Status: DC | PRN

## 2018-04-03 NOTE — Final Progress Note (Signed)
Physician Final Progress Note  Patient ID: Brittany Vaughn MRN: [id number] DOB/AGE: 11-23-2000 17 y.o.  Admit date: 04/03/2018 Admitting provider: Will Bonnet, MD Discharge date: 04/03/2018   Admission Diagnoses:  1) intrauterine pregnancy at [redacted]w[redacted]d 2) concern for rupture of membranes  Discharge Diagnoses:  1) intrauterine pregnancy at [redacted]w[redacted]d 2) concern for rupture of membranes - no evidence of membrane rupture  History of Present Illness: The patient is a 17 y.o. female G2P0 at [redacted]w[redacted]d who presents for concern for ROM. She woke up at 230 and went to the bathroom. She had a gush of fluid, which was clear.  She has not had more gushes. However, she has had some small leakage. Denies vaginal bleeding. She has had on and off contractions since noon yesterday. She notes +FM.   Hospital Course: admitted for observation. Fetal tracing reactive. Vital signs normal.  Cervix checked and unchanged from clinic yesterday.  ROM workup revealed nitrazine equivocal, negative ferning and negative pooling. Patient discharged with precautions and education.   Past Medical History:  Diagnosis Date  . Myopericarditis    a. 10/2017 in setting of pregnancy-->chest pain w/ trop of 4.91; b. 10/2017 Echo: EF 50-55%, no rwma.   Surgical history: denies  No current facility-administered medications on file prior to encounter.    Current Outpatient Medications on File Prior to Encounter  Medication Sig Dispense Refill  . Prenatal Vit-Fe Fumarate-FA (PRENATAL MULTIVITAMIN) TABS tablet Take 1 tablet by mouth daily at 12 noon. 90 tablet 4  . ferrous sulfate (FERROUSUL) 325 (65 FE) MG tablet Take 1 tablet (325 mg total) by mouth 2 (two) times daily. (Patient not taking: Reported on 03/11/2018) 60 tablet 1    Allergies  Allergen Reactions  . Tylenol [Acetaminophen]     Social History   Socioeconomic History  . Marital status: Single    Spouse name: Not on file  . Number of children: Not on file  . Years of  education: Not on file  . Highest education level: Not on file  Occupational History  . Not on file  Social Needs  . Financial resource strain: Not on file  . Food insecurity:    Worry: Not on file    Inability: Not on file  . Transportation needs:    Medical: Not on file    Non-medical: Not on file  Tobacco Use  . Smoking status: Never Smoker  . Smokeless tobacco: Never Used  Substance and Sexual Activity  . Alcohol use: No    Frequency: Never  . Drug use: Yes    Types: Marijuana  . Sexual activity: Yes    Birth control/protection: None  Lifestyle  . Physical activity:    Days per week: Not on file    Minutes per session: Not on file  . Stress: Not on file  Relationships  . Social connections:    Talks on phone: Not on file    Gets together: Not on file    Attends religious service: Not on file    Active member of club or organization: Not on file    Attends meetings of clubs or organizations: Not on file    Relationship status: Not on file  . Intimate partner violence:    Fear of current or ex partner: Not on file    Emotionally abused: Not on file    Physically abused: Not on file    Forced sexual activity: Not on file  Other Topics Concern  . Not on file  Social  History Narrative  . Not on file   Review of Systems  Constitutional: Negative.   HENT: Negative.   Eyes: Negative.   Respiratory: Negative.   Cardiovascular: Negative.   Gastrointestinal: Positive for abdominal pain (occasional ctx) and nausea. Negative for blood in stool, constipation, diarrhea, heartburn, melena and vomiting.  Genitourinary: Negative.   Musculoskeletal: Negative.   Skin: Negative.   Neurological: Negative.   Psychiatric/Behavioral: Negative.      Physical Exam: BP (!) 94/59 (BP Location: Left Arm)   Pulse 75   Temp 97.7 F (36.5 C) (Oral)   Resp 18   Ht 5\' 4"  (1.626 m)   Wt 76.2 kg   LMP 06/14/2017 (Exact Date)   BMI 28.84 kg/m   Gen: NAD CV: RRR Pulm:  CTAB Pelvic: NEFG, neg pooling, per RN cvx 2.5 cm Ext: no e/c/t  Consults: None  Significant Findings/ Diagnostic Studies:  nitrazine equivocal, ferning negative, pooling negative  Procedures: NST Baseline FHR: 130 beats/min Variability: moderate Accelerations: present Decelerations: absent Tocometry: occasional  Interpretation:  INDICATIONS: rule out uterine contractions RESULTS:  A NST procedure was performed with FHR monitoring and a normal baseline established, appropriate time of 20-40 minutes of evaluation, and accels >2 seen w 15x15 characteristics.  Results show a REACTIVE NST.    Discharge Condition: stable  Disposition: Discharge disposition: 01-Home or Self Care       Diet: Regular diet  Discharge Activity: Activity as tolerated   Allergies as of 04/03/2018      Reactions   Tylenol [acetaminophen]       Medication List    TAKE these medications   ferrous sulfate 325 (65 FE) MG tablet Take 1 tablet (325 mg total) by mouth 2 (two) times daily.   prenatal multivitamin Tabs tablet Take 1 tablet by mouth daily at 12 noon.      Follow-up Information    [facility] Follow up on 04/10/2018.   Specialty:  Obstetrics and Gynecology Why:  Keep previously scheduled appointment Contact information: [address] [place] [id number] [phone]          Total time spent taking care of this patient: 20 minutes  Signed: Prentice Docker, MD  04/03/2018, 4:03 AM

## 2018-04-03 NOTE — Discharge Summary (Signed)
See final progress note. 

## 2018-04-03 NOTE — Lactation Note (Signed)
This note was copied from a baby's chart. Lactation Consultation Note  Patient Name: Girl Anjana Cheek [id number] Date: 04/03/2018 Reason for consult: Initial assessment   Maternal Data Has patient been taught Hand Expression?: Yes Does the patient have breastfeeding experience prior to this delivery?: No  Feeding Feeding Type: Breast Fed  LATCH Score Latch: Repeated attempts needed to sustain latch, nipple held in mouth throughout feeding, stimulation needed to elicit sucking reflex.  Audible Swallowing: A few with stimulation  Type of Nipple: Everted at rest and after stimulation  Comfort (Breast/Nipple): Soft / non-tender  Hold (Positioning): Assistance needed to correctly position infant at breast and maintain latch.  LATCH Score: 7  Interventions Interventions: Breast feeding basics reviewed;Assisted with latch;Adjust position;Support pillows  Lactation Tools Discussed/Used     Consult Status  LC to room to assist with breastfeeding. Infant was skin to skin upon arrival and was showing feeding cues. Infant successfully latched to the right breast in the side lying position. A few audible swallows heard. LC reviewed expectations for the first 24 hours along with breastfeeding basics and told mother to breastfeed as long as infant continues to feed. This is mother's first baby.     Elvera Lennox 4/0/9735, 3:22 PM

## 2018-04-03 NOTE — Discharge Summary (Signed)
OB Discharge Summary     Patient Name: Brittany Vaughn DOB: 07/27/2001 MRN: [id number]  Date of admission: 04/03/2018 Delivering Provider: Rexene Agent, CNM  Date of Delivery: 04/03/2018  Date of discharge: 04/05/2018  Admitting diagnosis: contractions 39 wks preg Intrauterine pregnancy: [redacted]w[redacted]d     Secondary diagnosis: None     Discharge diagnosis: Term Pregnancy Delivered,                          Hospital course:  Onset of Labor With Vaginal Delivery     17 y.o. yo G1P0 at [redacted]w[redacted]d was admitted in Active Labor on 04/03/2018. Patient had an uncomplicated labor course as follows:  Membrane Rupture Time/Date: 9:30 AM ,04/03/2018   Intrapartum Procedures: Episiotomy: None [1]                                         Lacerations:  1st degree [2]  Patient had a delivery of a Viable infant. 04/03/2018  Information for the patient's newborn:  Landis, Cassaro [[id number]]  Delivery Method: Vag-Spont    Pateint had an uncomplicated postpartum course.  She is ambulating, tolerating a regular diet, passing flatus, and urinating well. Patient is discharged home in stable condition on 04/05/18.                                                                  Post partum procedures:none  Complications: None  Physical exam on 04/05/2018: Vitals:   04/04/18 1720 04/05/18 0021 04/05/18 0026 04/05/18 0916  BP: 90/69 (!) 73/42 97/66 95/63   Pulse: 98 79 76 82  Resp: 18 20  18   Temp: 98.3 F (36.8 C) 97.8 F (36.6 C)  98.4 F (36.9 C)  TempSrc: Oral Oral  Oral  SpO2: 100% 99%  99%  Weight:      Height:       General: alert, cooperative and no distress Lochia: appropriate Uterine Fundus: firm Incision: N/A DVT Evaluation: No evidence of DVT seen on physical exam. No cords or calf tenderness. No significant calf/ankle edema.  Labs: Lab Results  Component Value Date   WBC 24.5 (H) 04/04/2018   HGB 11.9 (L) 04/04/2018   HCT 34.0 (L) 04/04/2018   MCV 86.5 04/04/2018   PLT 240 04/04/2018    CMP Latest Ref Rng & Units 11/17/2017  Glucose 65 - 99 mg/dL 87  BUN 6 - 20 mg/dL 8  Creatinine 0.44 - 1.00 mg/dL 0.48  Sodium 135 - 145 mmol/L 139  Potassium 3.5 - 5.1 mmol/L 3.2(L)  Chloride 101 - 111 mmol/L 110  CO2 22 - 32 mmol/L 22  Calcium 8.9 - 10.3 mg/dL 8.1(L)    Discharge instruction: per After Visit Summary.  Medications:  Allergies as of 04/05/2018      Reactions   Tylenol [acetaminophen]       Medication List    STOP taking these medications   ferrous sulfate 325 (65 FE) MG tablet     TAKE these medications   ibuprofen 600 MG tablet Commonly known as:  ADVIL,MOTRIN Take 1 tablet (600 mg total) by mouth every 6 (six) hours.   prenatal  multivitamin Tabs tablet Take 1 tablet by mouth daily at 12 noon.            Discharge Care Instructions  (From admission, onward)         Start     Ordered   04/05/18 0000  Discharge wound care:    Comments:  Perform wound care instructions   04/05/18 1201          Diet: routine diet  Activity: Advance as tolerated. Pelvic rest for 6 weeks.   Outpatient follow up: Follow-up Information    Rexene Agent, CNM. Schedule an appointment as soon as possible for a visit in 2 week(s).   Specialty:  Certified Nurse Midwife Why:  Postpartum Mood check Contact information: [address] [place] [address] [phone]             Postpartum contraception: IUD Paragard Rhogam Given postpartum: no Rubella vaccine given postpartum: no Varicella vaccine given postpartum: no TDaP given antepartum or postpartum: Yes  Newborn Data: Live born female  Birth Weight: 8 lb 7.1 oz (3830 g) APGAR: 8, 9  Newborn Delivery   Birth date/time:  04/03/2018 14:04:00 Delivery type:  Vaginal, Spontaneous      Baby Feeding: Breast  Disposition:home with mother  SIGNED: Prentice Docker, MD 04/05/2018 12:03 PM

## 2018-04-03 NOTE — H&P (Addendum)
Obstetrics Admission History & Physical      HPI:  17 y.o. G1P0 @ [redacted]w[redacted]d (04/08/2018, by Ultrasound). Admitted on 04/03/2018:   Patient Active Problem List   Diagnosis Date Noted  . Labor and delivery, indication for care 04/03/2018  . Delivery normal 04/03/2018  . 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 Anemia in pregnancy, third trimester 02/11/2018  . Acute idiopathic pericarditis   . Acute chest pain 11/15/2017  . Headache in pregnancy, antepartum, second trimester 10/07/2017  . Marijuana use 10/07/2017  . Encounter for supervision of low-risk pregnancy, antepartum 10/02/2017  . Supervision of normal first teen pregnancy in second trimester 10/02/2017     Presents for SROM and painful contractions; advanced cervical dilation on arrival.   [facility] at: at [facility]. Pregnancy complicated by drug use.  ROS: A review of systems was performed and negative, except as stated in the above HPI. PMHx:  Past Medical History:  Diagnosis Date  . Myopericarditis    a. 10/2017 in setting of pregnancy-->chest pain w/ trop of 4.91; b. 10/2017 Echo: EF 50-55%, no rwma.   PSHx: History reviewed. No pertinent surgical history. Medications:  Medications Prior to Admission  Medication Sig Dispense Refill Last Dose  . ferrous sulfate (FERROUSUL) 325 (65 FE) MG tablet Take 1 tablet (325 mg total) by mouth 2 (two) times daily. (Patient not taking: Reported on 03/11/2018) 60 tablet 1 Not Taking  . Prenatal Vit-Fe Fumarate-FA (PRENATAL MULTIVITAMIN) TABS tablet Take 1 tablet by mouth daily at 12 noon. 90 tablet 4 Past Week at Unknown time   Allergies: is allergic to tylenol [acetaminophen]. OBHx:  OB History  Gravida Para Term Preterm AB Living  1            SAB TAB Ectopic Multiple Live Births               # Outcome Date GA Lbr Len/2nd Weight Sex Delivery Anes PTL Lv  1 Current            JJO:ACZYSAYT/KZSWFUXNATFT except as detailed in HPI.Marland Kitchen  No family  history of birth defects. Soc Hx: Never smoker, Alcohol: none and Recreational drug use: former  Objective:   Vitals:   04/03/18 1710 04/03/18 1805  BP: 103/76 107/78  Pulse: 96 78  Resp: 18 18  Temp: (!) 97.4 F (36.3 C) 98.8 F (37.1 C)  SpO2: 94% 98%   Constitutional: Well nourished, well developed female in no acute distress.  HEENT: normal Skin: Warm and dry.  Cardiovascular: Regular rate and rhythm.   Extremity: trace to 1+ bilateral pedal edema Respiratory: Normal respiratory effort Abdomen: gravid, painful contractions Neuro: Cranial nerves grossly intact Psych: Alert and Oriented x3. No memory deficits. Normal mood and affect.  MS: normal gait, normal bilateral lower extremity ROM/strength/stability.  Pelvic exam: is not limited by body habitus External Genitalia, Bartholin's glands, Urethra, Skene's glands: within normal limits Vagina: within normal limits and with blood in the vault Cervix: 9.5/100/+1 Uterus: Spontaneous uterine activity  Adnexa: not evaluated  EFM:FHR: 130 bpm, variability: moderate,  accelerations:  Present,  decelerations:  Absent Toco: Frequency: Every 2-3.5 minutes, Duration: 40-70 seconds and Intensity: moderate   Perinatal info:  Blood type: O positive Rubella - Immune Varicella - Immune TDaP Given during third trimester of this pregnancy RPR NR / HIV Neg/ HBsAg Neg   Assessment & Plan:   17 y.o. G1P0 @ [redacted]w[redacted]d, Admitted on 04/03/2018 in advanced labor.    Admit for labor, Fetal Wellbeing  Reassuring and GBS status negative, treat as needed  Avel Sensor, [facility], [facility] 04/03/2018

## 2018-04-03 NOTE — OB Triage Note (Signed)
Patient arrived in triage with c/o leaking fluid at approx 0224, clear, without odor. Also reports feeling contractions every 5-70mins. Reports good fetal movement. Denies vaginal bleeding. EFM applied and assessing.

## 2018-04-03 NOTE — OB Triage Note (Signed)
Discharge instructions provided and reviewed.  Follow up care discussed.  Pt verbalized understanding.

## 2018-04-04 LAB — CBC
HCT: 34 % — ABNORMAL LOW (ref 35.0–47.0)
HEMOGLOBIN: 11.9 g/dL — AB (ref 12.0–16.0)
MCH: 30.4 pg (ref 26.0–34.0)
MCHC: 35.1 g/dL (ref 32.0–36.0)
MCV: 86.5 fL (ref 80.0–100.0)
PLATELETS: 240 10*3/uL (ref 150–440)
RBC: 3.93 MIL/uL (ref 3.80–5.20)
RDW: 13.9 % (ref 11.5–14.5)
WBC: 24.5 10*3/uL — AB (ref 3.6–11.0)

## 2018-04-04 LAB — RPR: RPR Ser Ql: NONREACTIVE

## 2018-04-04 NOTE — Progress Notes (Signed)
CH received OR for AD, CH presented to the patient's room, knocked on the door and identified self as the [facility], [place] was welcomed in, the patient was in the bed resting and accompanied by family member. Pastoral presence ensued. The patient didn't initially remember inquiring about an AD, until it was expounded upon further. The patient was very tired from labor and wanted to look over a packet and possibly proceed with completion of AD tomorrow, prior to her anticipated d/c. Warm words were conferred upon the mom for giving birth. An AD packet was left with the patient to be reviewed at a later time, with possible completion 8/11     04/04/18 1600  Clinical Encounter Type  Visited With Patient and family together  Referral From Physician  Consult/Referral To Chaplain  Spiritual Encounters  Spiritual Needs Other (Comment)  Stress Factors  Patient Stress Factors None identified  Family Stress Factors None identified  Advance Directives (For Healthcare)  Does Patient Have a Medical Advance Directive? No  Does patient want to make changes to medical advance directive? Yes (Inpatient - patient requests chaplain consult to change a medical advance directive)  Would patient like information on creating a medical advance directive? Yes (Inpatient - patient requests chaplain consult to create a medical advance directive)  [place] Directives  Does Patient Have a Mental Health Advance Directive? No

## 2018-04-04 NOTE — Progress Notes (Signed)
Obstetric Postpartum Daily Progress Note Subjective:  17 y.o. G1P0 postpartum day #1 status post vaginal delivery.  She is ambulating, is tolerating po, is voiding spontaneously.  Her pain is well controlled on PO pain medications. Her lochia is less than menses.   Medications SCHEDULED MEDICATIONS  . ibuprofen  600 mg Oral Q6H  . prenatal multivitamin  1 tablet Oral Q1200  . senna-docusate  2 tablet Oral Q24H    MEDICATION INFUSIONS    PRN MEDICATIONS  benzocaine-Menthol, coconut oil, witch hazel-glycerin **AND** dibucaine, diphenhydrAMINE, ondansetron **OR** ondansetron (ZOFRAN) IV, oxyCODONE, oxyCODONE, simethicone    Objective:   Vitals:   04/03/18 1805 04/03/18 2014 04/03/18 2301 04/04/18 0804  BP: 107/78 106/74 95/65 96/68   Pulse: 78 100  78  Resp: 18 18 20 16   Temp: 98.8 F (37.1 C) 98.5 F (36.9 C) 98.4 F (36.9 C) 98 F (36.7 C)  TempSrc: Axillary Oral Oral Oral  SpO2: 98% 100%  100%  Weight:      Height:        Current Vital Signs 24h Vital Sign Ranges  T 98 F (36.7 C) Temp  Avg: 98.2 F (36.8 C)  Min: 97.4 F (36.3 C)  Max: 98.8 F (37.1 C)  BP 96/68 BP  Min: 95/65  Max: 118/65  HR 78 Pulse  Avg: 82.3  Min: 74  Max: 100  RR 16 Resp  Avg: 18.2  Min: 16  Max: 20  SaO2 100 % Room Air SpO2  Avg: 98 %  Min: 94 %  Max: 100 %       24 Hour I/O Current Shift I/O  Time Ins Outs 08/09 0701 - 08/10 0700 In: -  Out: 1163  08/10 0701 - 08/10 1900 In: 360 [P.O.:360] Out: -   General: NAD Pulmonary: no increased work of breathing Abdomen: non-distended, non-tender, fundus firm at level of umbilicus Extremities: no edema, no erythema, no tenderness  Labs:  Recent Labs  Lab 04/03/18 1524 04/04/18 0445  WBC 30.7* 24.5*  HGB 12.3 11.9*  HCT 36.0 34.0*  PLT 243 240     Assessment:   17 y.o. G1P0 postpartum day # 1 status post SVD  Plan:   1) Acute blood loss anemia - hemodynamically stable and asymptomatic - po ferrous sulfate  2) O POS / Rubella  2.67 (02/07 0909)/ Varicella Immune  3) TDAP status given - 6/5, Influenza vaccine: received 10/02/17  4) breast feeding /Contraception = undecided. considering pill vs patch. Discussed LARCs, as well.  5) Disposition: home tomorrow  Prentice Docker, MD 04/04/2018 11:21 AM

## 2018-04-04 NOTE — Lactation Note (Signed)
This note was copied from a baby's chart. Lactation Consultation Note  Patient Name: Brittany Vaughn [id number] Date: 04/04/2018 Reason for consult: Follow-up assessment;Primapara   Maternal Data Formula Feeding for Exclusion: No Has patient been taught Hand Expression?: Yes Does the patient have breastfeeding experience prior to this delivery?: No  Feeding Feeding Type: Breast Fed  LATCH Score Latch: Grasps breast easily, tongue down, lips flanged, rhythmical sucking.  Audible Swallowing: Spontaneous and intermittent  Type of Nipple: Everted at rest and after stimulation  Comfort (Breast/Nipple): Filling, red/small blisters or bruises, mild/mod discomfort  Hold (Positioning): Assistance needed to correctly position infant at breast and maintain latch.  LATCH Score: 8  Interventions Interventions: Breast feeding basics reviewed;Assisted with latch;Adjust position;Support pillows;Coconut oil  Lactation Tools Discussed/Used WIC Program: Yes Pump Review: Setup, frequency, and cleaning;Milk Storage;Other (comment)(given [employer] milk storage magnet) Initiated by:: Brittany Vaughn RNC IBCLC Date initiated:: 04/04/18   Consult Status Consult Status: PRN    Ferol Luz 04/04/2018, 1:50 PM

## 2018-04-05 MED ORDER — IBUPROFEN 600 MG PO TABS: 600.0000 mg | ORAL_TABLET | Freq: Four times a day (QID) | ORAL | 0 refills | Status: DC

## 2018-04-05 NOTE — Lactation Note (Signed)
This note was copied from a baby's chart. Lactation Consultation Note  Patient Name: Brittany Vaughn [id number] Date: 04/05/2018 Reason for consult: Follow-up assessment;Primapara;Term;Hyperbilirubinemia Brittany Vaughn was sleepy on right side.  Lab tech came in to draw bilirubin which woke her for good breast feed.  When hand expressed, got lots of transitional breast milk.  Mom reports feeling fuller before feeding and softer after breast feed.  Brittany Vaughn latched to left breast in football hold without difficulty and began strong rhythmic sucking with frequent swallows.  Demonstrated where to stimulate Brittany Vaughn and how to massage breast to keep her actively sucking for 28 minutes on left breast.  Lactation discharge instructions given to mom and mother-in-law, whose home the parents will be going after discharge today.  Reviewed supply and demand, normal course of lactation and routine newborn feeding patterns.  Reviewed when and how use manual pump in breast pump kit if needed.  Instructed in lactation community resources including mom's express club support group and lactation numbers if have further questions, concerns or if assistance is needed. Maternal Data Formula Feeding for Exclusion: No Reason for exclusion: (Was never exclusively formula feeding - MD order to supplement earlier) Has patient been taught Hand Expression?: Yes(Can easily hand express transitional breast milk now) Does the patient have breastfeeding experience prior to this delivery?: No  Feeding Feeding Type: Breast Fed Length of feed: 28 min  LATCH Score Latch: Grasps breast easily, tongue down, lips flanged, rhythmical sucking.  Audible Swallowing: Spontaneous and intermittent  Type of Nipple: Everted at rest and after stimulation  Comfort (Breast/Nipple): Filling, red/small blisters or bruises, mild/mod discomfort  Hold (Positioning): Assistance needed to correctly position infant at breast and maintain latch.  LATCH  Score: 8  Interventions Interventions: Breast feeding basics reviewed;Reverse pressure;Assisted with latch;Breast compression;Coconut oil;Adjust position;Breast massage;Support pillows;Comfort gels;Hand express;Position options  Lactation Tools Discussed/Used WIC Program: Yes Pump Review: Setup, frequency, and cleaning;Milk Storage;Other (comment) Initiated by:: Chaya Jan, RN Date initiated:: 04/04/18   Consult Status Consult Status: PRN Follow-up type: Call as needed    Jarold Motto 04/05/2018, 6:38 PM

## 2018-04-05 NOTE — Progress Notes (Signed)
Reviewed D/C instructions with pt and family. Pt verbalized understanding of teaching. Discharged to home via W/C. Pt to schedule f/u appt.  

## 2018-04-10 ENCOUNTER — Encounter: Payer: Medicaid Other | Admitting: Obstetrics and Gynecology

## 2018-04-17 ENCOUNTER — Ambulatory Visit: Payer: Medicaid Other | Admitting: Advanced Practice Midwife

## 2018-04-23 ENCOUNTER — Encounter: Payer: Self-pay | Admitting: Advanced Practice Midwife

## 2018-04-23 ENCOUNTER — Ambulatory Visit (INDEPENDENT_AMBULATORY_CARE_PROVIDER_SITE_OTHER): Payer: Medicaid Other | Admitting: Advanced Practice Midwife

## 2018-04-23 DIAGNOSIS — Z30013 Encounter for initial prescription of injectable contraceptive: Secondary | ICD-10-CM

## 2018-04-23 MED ORDER — MEDROXYPROGESTERONE ACETATE 150 MG/ML IM SUSP: 150.0000 mg | INTRAMUSCULAR | 3 refills | Status: DC

## 2018-04-23 NOTE — Progress Notes (Signed)
S: The patient is here today for 2 week postpartum follow up for evaluation of depression. She reports doing well. She is getting adequate sleep and she has a good support system. She is still having a small amount of bleeding. The baby is fed with formula and gaining weight well and sleeping well. She has no concerns today and is wondering if she can start her Depo shots. Questions answered regarding possible side effects and when it is safe to resume intercourse.   O: Vital Signs: BP 120/80   Ht 5\' 4"  (1.626 m)   Wt 152 lb (68.9 kg)   BMI 26.09 kg/m  Constitutional: Well nourished, well developed female in no acute distress.  HEENT: normal Skin: Warm and dry.  Respiratory:  Normal respiratory effort Psych: Alert and Oriented x3. No memory deficits. Normal mood and affect.   EPDS score is 8  A: 17 yo G1 P1 at 2 weeks postpartum, no concerns for postpartum depression at this time  P: Rx sent for Depo Provera  Return for Depo injection Return for 6 week postpartum visit and PRN  Rod Can, [place], [place]

## 2018-04-28 ENCOUNTER — Ambulatory Visit (INDEPENDENT_AMBULATORY_CARE_PROVIDER_SITE_OTHER): Payer: Medicaid Other

## 2018-04-28 DIAGNOSIS — Z3049 Encounter for surveillance of other contraceptives: Secondary | ICD-10-CM

## 2018-04-28 DIAGNOSIS — Z3042 Encounter for surveillance of injectable contraceptive: Secondary | ICD-10-CM

## 2018-04-28 MED ORDER — MEDROXYPROGESTERONE ACETATE 150 MG/ML IM SUSP
150.0000 mg | Freq: Once | INTRAMUSCULAR | Status: AC
  Administered 2018-04-28: 150 mg via INTRAMUSCULAR

## 2018-05-15 ENCOUNTER — Inpatient Hospital Stay
Admission: EM | Admit: 2018-05-15 | Discharge: 2018-05-19 | DRG: 343 | Disposition: A | Discharge status: Home / Self Care | Payer: Medicaid Other | Attending: Surgery | Admitting: Surgery

## 2018-05-15 ENCOUNTER — Emergency Department: Payer: Medicaid Other

## 2018-05-15 ENCOUNTER — Encounter: Payer: Self-pay | Admitting: Emergency Medicine

## 2018-05-15 ENCOUNTER — Other Ambulatory Visit: Payer: Self-pay

## 2018-05-15 DIAGNOSIS — I959 Hypotension, unspecified: Secondary | ICD-10-CM | POA: Diagnosis not present

## 2018-05-15 DIAGNOSIS — Z886 Allergy status to analgesic agent status: Secondary | ICD-10-CM | POA: Diagnosis not present

## 2018-05-15 DIAGNOSIS — Z793 Long term (current) use of hormonal contraceptives: Secondary | ICD-10-CM | POA: Diagnosis not present

## 2018-05-15 DIAGNOSIS — R000 Tachycardia, unspecified: Secondary | ICD-10-CM | POA: Diagnosis not present

## 2018-05-15 DIAGNOSIS — K3531 Acute appendicitis with localized peritonitis and gangrene, without perforation: Principal | ICD-10-CM | POA: Diagnosis present

## 2018-05-15 DIAGNOSIS — R1031 Right lower quadrant pain: Secondary | ICD-10-CM

## 2018-05-15 DIAGNOSIS — K3532 Acute appendicitis with perforation and localized peritonitis, without abscess: Secondary | ICD-10-CM | POA: Diagnosis present

## 2018-05-15 DIAGNOSIS — Z23 Encounter for immunization: Secondary | ICD-10-CM | POA: Diagnosis not present

## 2018-05-15 HISTORY — DX: Acute appendicitis with perforation, localized peritonitis, and gangrene, without abscess: K35.32

## 2018-05-15 LAB — URINALYSIS, COMPLETE (UACMP) WITH MICROSCOPIC
BILIRUBIN URINE: NEGATIVE
Bacteria, UA: NONE SEEN
Glucose, UA: NEGATIVE mg/dL
Ketones, ur: 80 mg/dL — AB
LEUKOCYTES UA: NEGATIVE
NITRITE: NEGATIVE
PH: 5 (ref 5.0–8.0)
Protein, ur: 30 mg/dL — AB
SPECIFIC GRAVITY, URINE: 1.024 (ref 1.005–1.030)

## 2018-05-15 LAB — COMPREHENSIVE METABOLIC PANEL
ALK PHOS: 88 U/L (ref 38–126)
ALT: 12 U/L (ref 0–44)
AST: 14 U/L — AB (ref 15–41)
Albumin: 4 g/dL (ref 3.5–5.0)
Anion gap: 12 (ref 5–15)
BILIRUBIN TOTAL: 1.4 mg/dL — AB (ref 0.3–1.2)
BUN: 10 mg/dL (ref 6–20)
CO2: 22 mmol/L (ref 22–32)
Calcium: 8.8 mg/dL — ABNORMAL LOW (ref 8.9–10.3)
Chloride: 104 mmol/L (ref 98–111)
Creatinine, Ser: 0.54 mg/dL (ref 0.44–1.00)
GFR calc Af Amer: >60 mL/min (ref >60)
Glucose, Bld: 126 mg/dL — ABNORMAL HIGH (ref 70–99)
Potassium: 3.2 mmol/L — ABNORMAL LOW (ref 3.5–5.1)
Sodium: 138 mmol/L (ref 135–145)
Total Protein: 7.6 g/dL (ref 6.5–8.1)

## 2018-05-15 LAB — CBC
HCT: 37.4 % (ref 35.0–47.0)
Hemoglobin: 13.1 g/dL (ref 12.0–16.0)
MCH: 28.8 pg (ref 26.0–34.0)
MCHC: 34.9 g/dL (ref 32.0–36.0)
MCV: 82.5 fL (ref 80.0–100.0)
Platelets: 315 10*3/uL (ref 150–440)
RBC: 4.53 MIL/uL (ref 3.80–5.20)
RDW: 13.3 % (ref 11.5–14.5)
WBC: 23.4 10*3/uL — AB (ref 3.6–11.0)

## 2018-05-15 LAB — POCT PREGNANCY, URINE: Preg Test, Ur: NEGATIVE

## 2018-05-15 LAB — LIPASE, BLOOD: Lipase: 19 U/L (ref 11–51)

## 2018-05-15 MED ORDER — ONDANSETRON HCL 4 MG/2ML IJ SOLN: 4.0000 mg | Freq: Four times a day (QID) | INTRAMUSCULAR | Status: DC | PRN

## 2018-05-15 MED ORDER — ONDANSETRON HCL 4 MG/2ML IJ SOLN
4.0000 mg | Freq: Once | INTRAMUSCULAR | Status: AC
Start: 2018-05-15 — End: 2018-05-15
  Administered 2018-05-15: 4 mg via INTRAVENOUS

## 2018-05-15 MED ORDER — SODIUM CHLORIDE 0.9 % IV BOLUS
1000.0000 mL | Freq: Once | INTRAVENOUS | Status: AC
  Administered 2018-05-15: 1000 mL via INTRAVENOUS

## 2018-05-15 MED ORDER — PIPERACILLIN-TAZOBACTAM 3.375 G IVPB 30 MIN
3.3750 g | Freq: Once | INTRAVENOUS | Status: AC
  Administered 2018-05-15: 3.375 g via INTRAVENOUS
  Filled 2018-05-15: qty 50

## 2018-05-15 MED ORDER — INFLUENZA VAC SPLIT QUAD 0.5 ML IM SUSY
0.5000 mL | PREFILLED_SYRINGE | INTRAMUSCULAR | Status: AC
  Administered 2018-05-19: 0.5 mL via INTRAMUSCULAR
  Filled 2018-05-15: qty 0.5

## 2018-05-15 MED ORDER — KETOROLAC TROMETHAMINE 30 MG/ML IJ SOLN
30.0000 mg | Freq: Four times a day (QID) | INTRAMUSCULAR | Status: DC
  Administered 2018-05-15 – 2018-05-19 (×15): 30 mg via INTRAVENOUS
  Filled 2018-05-15 (×15): qty 1

## 2018-05-15 MED ORDER — PIPERACILLIN-TAZOBACTAM 3.375 G IVPB
3.3750 g | Freq: Three times a day (TID) | INTRAVENOUS | Status: DC
  Administered 2018-05-16 – 2018-05-19 (×11): 3.375 g via INTRAVENOUS
  Filled 2018-05-15 (×11): qty 50

## 2018-05-15 MED ORDER — ONDANSETRON HCL 4 MG/2ML IJ SOLN
4.0000 mg | Freq: Once | INTRAMUSCULAR | Status: DC | PRN
  Filled 2018-05-15 (×2): qty 2

## 2018-05-15 MED ORDER — IOPAMIDOL (ISOVUE-300) INJECTION 61%
100.0000 mL | Freq: Once | INTRAVENOUS | Status: AC | PRN
  Administered 2018-05-15: 100 mL via INTRAVENOUS
  Filled 2018-05-15: qty 100

## 2018-05-15 MED ORDER — LACTATED RINGERS IV SOLN
125.0000 mL/h | INTRAVENOUS | Status: DC
  Administered 2018-05-15 – 2018-05-16 (×2): 125 mL/h via INTRAVENOUS

## 2018-05-15 MED ORDER — HYDROMORPHONE HCL 1 MG/ML IJ SOLN
1.0000 mg | INTRAMUSCULAR | Status: AC
  Administered 2018-05-15: 1 mg via INTRAVENOUS
  Filled 2018-05-15: qty 1

## 2018-05-15 MED ORDER — MORPHINE SULFATE (PF) 4 MG/ML IV SOLN
4.0000 mg | Freq: Once | INTRAVENOUS | Status: AC
  Administered 2018-05-15 (×2): 4 mg via INTRAVENOUS

## 2018-05-15 MED ORDER — FAMOTIDINE IN NACL 20-0.9 MG/50ML-% IV SOLN
20.0000 mg | Freq: Two times a day (BID) | INTRAVENOUS | Status: DC
  Administered 2018-05-15 – 2018-05-18 (×5): 20 mg via INTRAVENOUS
  Filled 2018-05-15 (×5): qty 50

## 2018-05-15 MED ORDER — ENOXAPARIN SODIUM 40 MG/0.4ML ~~LOC~~ SOLN
40.0000 mg | SUBCUTANEOUS | Status: DC
  Administered 2018-05-15 – 2018-05-18 (×4): 40 mg via SUBCUTANEOUS
  Filled 2018-05-15 (×4): qty 0.4

## 2018-05-15 MED ORDER — HYDROMORPHONE HCL 1 MG/ML IJ SOLN
0.5000 mg | INTRAMUSCULAR | Status: DC | PRN
  Administered 2018-05-15 – 2018-05-17 (×8): 0.5 mg via INTRAVENOUS
  Filled 2018-05-15 (×8): qty 0.5

## 2018-05-15 MED ORDER — MORPHINE SULFATE (PF) 4 MG/ML IV SOLN
INTRAVENOUS | Status: AC
  Administered 2018-05-15: 4 mg via INTRAVENOUS
  Filled 2018-05-15: qty 1

## 2018-05-15 MED ORDER — HYDROMORPHONE HCL 1 MG/ML IJ SOLN: 0.5000 mg | INTRAMUSCULAR | Status: DC | PRN

## 2018-05-15 MED ORDER — ONDANSETRON 4 MG PO TBDP: 4.0000 mg | ORAL_TABLET | Freq: Four times a day (QID) | ORAL | Status: DC | PRN

## 2018-05-15 NOTE — ED Provider Notes (Signed)
[facility] Emergency Department Provider Note  ____________________________________________  Time seen: Approximately 7:01 PM  I have reviewed the triage vital signs and the nursing notes.   HISTORY  Chief Complaint Nausea; Emesis; and Abdominal Pain    HPI Brittany Vaughn is a 17 y.o. female with a history of pregnancy complicated by myopericarditis in March 2019, delivered April 03, 2018, who reports onset of right-sided abdominal pain yesterday morning, constant, gradually worsening, now severe.  She reports the pain is worse than the pain she had with labor a month ago during which she had no analgesics or epidural.  Pain has migrated to the right lower quadrant, nonradiating.  Worse lying on her back or moving.  No alleviating factors.  Denies vomiting constipation or diarrhea.  She reports delivery was uncomplicated.  Baby is doing well.  Baby is bottle-fed not nursing.  She reports that after delivery all of her vaginal bleeding and discharge resolved and she was back to normal.  She does report that she started her period just a few days ago but does not think that this pain is related.   Past Medical History:  Diagnosis Date  . Myopericarditis    a. 10/2017 in setting of pregnancy-->chest pain w/ trop of 4.91; b. 10/2017 Echo: EF 50-55%, no rwma.     Patient Active Problem List   Diagnosis Date Noted  . Ruptured appendicitis 05/15/2018  . Labor and delivery, indication for care 04/03/2018  . Delivery normal 04/03/2018  . 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 Anemia in pregnancy, third trimester 02/11/2018  . Acute idiopathic pericarditis   . Acute chest pain 11/15/2017  . Headache in pregnancy, antepartum, second trimester 10/07/2017  . Marijuana use 10/07/2017  . Encounter for supervision of low-risk pregnancy, antepartum 10/02/2017  . Supervision of normal first teen pregnancy in second trimester 10/02/2017      History reviewed. No pertinent surgical history.   Prior to Admission medications   Medication Sig Start Date End Date Taking? Authorizing Provider  ibuprofen (ADVIL,MOTRIN) 600 MG tablet Take 1 tablet (600 mg total) by mouth every 6 (six) hours. Patient not taking: Reported on 04/23/2018 04/05/18   Will Bonnet, MD  medroxyPROGESTERone (DEPO-PROVERA) 150 MG/ML injection Inject 1 mL (150 mg total) into the muscle every 3 (three) months. 04/23/18   Rod Can, CNM  Prenatal Vit-Fe Fumarate-FA (PRENATAL MULTIVITAMIN) TABS tablet Take 1 tablet by mouth daily at 12 noon. Patient not taking: Reported on 04/23/2018 01/08/18   Gae Dry, MD     Allergies Tylenol [acetaminophen]   No family history on file.  Social History Social History   Tobacco Use  . Smoking status: Never Smoker  . Smokeless tobacco: Never Used  Substance Use Topics  . Alcohol use: No    Frequency: Never  . Drug use: Yes    Types: Marijuana    Review of Systems  Constitutional:   No fever positive chills.  ENT:   No sore throat. No rhinorrhea. Cardiovascular:   No chest pain or syncope. Respiratory:   No dyspnea or cough. Gastrointestinal: Positive as above for abdominal pain without vomiting and diarrhea.  Musculoskeletal:   Negative for focal pain or swelling All other systems reviewed and are negative except as documented above in ROS and HPI.  ____________________________________________   PHYSICAL EXAM:  VITAL SIGNS: ED Triage Vitals  Enc Vitals Group     BP 05/15/18 1451 111/76     Pulse Rate 05/15/18  1451 (!) 103     Resp 05/15/18 1451 18     Temp 05/15/18 1451 99.2 F (37.3 C)     Temp Source 05/15/18 1451 Oral     SpO2 05/15/18 1451 96 %     Weight 05/15/18 1451 150 lb (68 kg)     Height 05/15/18 1451 5\' 4"  (1.626 m)     Head Circumference --      Peak Flow --      Pain Score 05/15/18 1456 7     Pain Loc --      Pain Edu? --      Excl. in [place]? --     Vital  signs reviewed, nursing assessments reviewed.   Constitutional:   Alert and oriented.  Ill-appearing Eyes:   Conjunctivae are normal. EOMI. PERRL. ENT      Head:   Normocephalic and atraumatic.      Nose:   No congestion/rhinnorhea.       Mouth/Throat:   MMM, no pharyngeal erythema. No peritonsillar mass.       Neck:   No meningismus. Full ROM. Hematological/Lymphatic/Immunilogical:   No cervical lymphadenopathy. Cardiovascular:   Tachycardia heart rate 100. Symmetric bilateral radial and DP pulses.  No murmurs. Cap refill less than 2 seconds. Respiratory:   Normal respiratory effort without tachypnea/retractions. Breath sounds are clear and equal bilaterally. No wheezes/rales/rhonchi. Gastrointestinal:   Soft with diffuse tenderness, much worse in the right lower quadrant. Non distended.  No rebound, rigidity, or guarding. Musculoskeletal:   Normal range of motion in all extremities. No joint effusions.  No lower extremity tenderness.  No edema. Neurologic:   Normal speech and language.  Motor grossly intact. No acute focal neurologic deficits are appreciated.  Skin:    Skin is warm, dry and intact. No rash noted.  No petechiae, purpura, or bullae.  ____________________________________________    LABS (pertinent positives/negatives) (all labs ordered are listed, but only abnormal results are displayed) Labs Reviewed  COMPREHENSIVE METABOLIC PANEL - Abnormal; Notable for the following components:      Result Value   Potassium 3.2 (*)    Glucose, Bld 126 (*)    Calcium 8.8 (*)    AST 14 (*)    Total Bilirubin 1.4 (*)    All other components within normal limits  CBC - Abnormal; Notable for the following components:   WBC 23.4 (*)    All other components within normal limits  LIPASE, BLOOD  URINALYSIS, COMPLETE (UACMP) WITH MICROSCOPIC  BASIC METABOLIC PANEL  MAGNESIUM  CBC WITH DIFFERENTIAL/PLATELET  POC URINE PREG, ED  POCT PREGNANCY, URINE    ____________________________________________   EKG    ____________________________________________    RADIOLOGY  Ct Abdomen Pelvis W Contrast  Result Date: 05/15/2018 CLINICAL DATA:  Abdominal pain since yesterday. Status post delivery 04/03/2018 EXAM: CT ABDOMEN AND PELVIS WITH CONTRAST TECHNIQUE: Multidetector CT imaging of the abdomen and pelvis was performed using the standard protocol following bolus administration of intravenous contrast. CONTRAST:  16mL ISOVUE-300 IOPAMIDOL (ISOVUE-300) INJECTION 61% COMPARISON:  None. FINDINGS: Lower chest: Unremarkable. Hepatobiliary: No focal abnormality within the liver parenchyma. There is no evidence for gallstones, gallbladder wall thickening, or pericholecystic fluid. No intrahepatic or extrahepatic biliary dilation. Pancreas: No focal mass lesion. No dilatation of the main duct. No intraparenchymal cyst. No peripancreatic edema. Spleen: No splenomegaly. No focal mass lesion. Adrenals/Urinary Tract: No adrenal nodule or mass. Kidneys are unremarkable. No evidence for hydroureter. The urinary bladder appears normal for the degree of distention.  Stomach/Bowel: Stomach is nondistended. No gastric wall thickening. No evidence of outlet obstruction. Duodenum is normally positioned as is the ligament of Treitz. No small bowel wall thickening. No small bowel dilatation. The terminal ileum is normal. The appendix is markedly dilated up to 16 mm diameter. There is substantial pericolonic edema/inflammation. 10 x 4 mm appendicoliths is identified towards the base of the appendix with the mid and tip of the appendix beyond the appendicoliths markedly abnormal. Coronal image 34 series 5 clearly demonstrates extraluminal gas at the tip of the appendix, also visible on sagittal image 44 of series 6. Fluid is noted around the tip of the appendix with fluid noted in the right adnexal space, cul-de-sac, and in the space between the bladder dome and the anterior  uterus (sagittal image 54/series 6). No gross colonic mass. No colonic wall thickening. No substantial diverticular change. Vascular/Lymphatic: No abdominal aortic aneurysm. No abdominal aortic atherosclerotic calcification. There is no gastrohepatic or hepatoduodenal ligament lymphadenopathy. No intraperitoneal or retroperitoneal lymphadenopathy. No pelvic sidewall lymphadenopathy. Reproductive: Dominant follicle noted in both ovaries. Other: Free fluid in the pelvis, as described above. Musculoskeletal: No worrisome lytic or sclerotic osseous abnormality. 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 IMPRESSION: 1. Ruptured appendicitis with appendicolith near the base of the appendix. Free gas and free fluid is seen in the pelvis but no discrete rim enhancing abscess evident at this time. I called these results at the time of interpretation on 05/15/2018 at 6:33 pm to Dr. Carrie Mew , who verbally acknowledged these results. Electronically Signed   By: Misty Stanley M.D.   On: 05/15/2018 18:34    ____________________________________________   PROCEDURES Procedures  ____________________________________________  DIFFERENTIAL DIAGNOSIS   Appendicitis, torsion, menstrual pain, colitis  CLINICAL IMPRESSION / ASSESSMENT AND PLAN / ED COURSE  Pertinent labs & imaging results that were available during my care of the patient were reviewed by me and considered in my medical decision making (see chart for details).    Patient presents with severe right-sided abdominal pain worse in the right lower quadrant.  Marked leukocytosis of 23,000, high suspicion of appendicitis.  Afebrile.  Not septic.  IV morphine 4 mg, Zofran 4 mg IV, CT abdomen pelvis.  Clinical Course as of May 15 1900  Fri May 15, 2018  [id number] Received call from radiologist about perforated appendicitis.  IV Zosyn ordered.  Surgery paged.  IV fluid bolus.   [PS]    Clinical Course User Index [PS] Carrie Mew, MD      ----------------------------------------- 7:13 PM on 05/15/2018 -----------------------------------------  Zosyn finished.  Discussed with Dr. Hampton Abbot who will admit.  Planning to defer operative management given the degree of inflammation currently planning to continue IV antibiotics and monitor clinical progress.  Pain was worsening and becoming severe again so the patient another dose of pain medication with Dilaudid 1 mg IV.  ____________________________________________   FINAL CLINICAL IMPRESSION(S) / ED DIAGNOSES    Final diagnoses:  Acute perforated appendicitis  RLQ abdominal pain     ED Discharge Orders    None      Portions of this note were generated with dragon dictation software. Dictation errors may occur despite best attempts at proofreading.    Carrie Mew, MD 05/15/18 [phone]

## 2018-05-15 NOTE — ED Triage Notes (Signed)
Pt reports abdominal pain since yesterday to her right side. Pt also reports some urinary urgency and frequency. Pt states just had a baby 04/03/2018.

## 2018-05-15 NOTE — ED Notes (Signed)
Patient transported to CT 

## 2018-05-15 NOTE — Progress Notes (Signed)
05/15/18  Called by Dr. Joni Fears in the ED regarding this patient with ruptured appendicitis.  Her pain started yesterday.  She came in with low grade tachycardia that has improved and WBC of 23.4.  I have independently viewed the patient's CT scan.  She has a lot of inflammation in the pericolonic area and around the appendix.  There are bubbles of gas at the distal appendix consistent with rupture.  There is no free air otherwise.  Given rupture and significant inflammation, discussed with Dr. Joni Fears that we would admit patient for conservative management.  Will make her NPO, with IV fluid hydration, appropriate pain and nausea control, and will continue her on IV Zosyn.  First dose ordered by Dr. Joni Fears.    H&P to follow in AM.  Olean Ree, MD

## 2018-05-16 ENCOUNTER — Inpatient Hospital Stay: Payer: Medicaid Other | Admitting: Certified Registered"

## 2018-05-16 ENCOUNTER — Encounter
Admission: EM | Disposition: A | Discharge status: Home / Self Care | Payer: Self-pay | Source: Home / Self Care | Attending: Surgery

## 2018-05-16 DIAGNOSIS — K3532 Acute appendicitis with perforation and localized peritonitis, without abscess: Secondary | ICD-10-CM

## 2018-05-16 HISTORY — PX: LAPAROSCOPIC APPENDECTOMY: SHX408

## 2018-05-16 LAB — CBC WITH DIFFERENTIAL/PLATELET
BASOS ABS: 0 10*3/uL (ref 0–0.1)
BASOS PCT: 0 %
EOS ABS: 0 10*3/uL (ref 0–0.7)
EOS PCT: 0 %
HCT: 32.9 % — ABNORMAL LOW (ref 35.0–47.0)
Hemoglobin: 11.7 g/dL — ABNORMAL LOW (ref 12.0–16.0)
Lymphocytes Relative: 7 %
Lymphs Abs: 0.7 10*3/uL — ABNORMAL LOW (ref 1.0–3.6)
MCH: 30.3 pg (ref 26.0–34.0)
MCHC: 35.7 g/dL (ref 32.0–36.0)
MCV: 84.9 fL (ref 80.0–100.0)
MONOS PCT: 6 %
Monocytes Absolute: 0.6 10*3/uL (ref 0.2–0.9)
Neutro Abs: 9 10*3/uL — ABNORMAL HIGH (ref 1.4–6.5)
Neutrophils Relative %: 87 %
PLATELETS: 220 10*3/uL (ref 150–440)
RBC: 3.87 MIL/uL (ref 3.80–5.20)
RDW: 13.4 % (ref 11.5–14.5)
WBC: 10.3 10*3/uL (ref 3.6–11.0)

## 2018-05-16 LAB — BASIC METABOLIC PANEL
Anion gap: 9 (ref 5–15)
BUN: 10 mg/dL (ref 6–20)
CO2: 22 mmol/L (ref 22–32)
Calcium: 7.9 mg/dL — ABNORMAL LOW (ref 8.9–10.3)
Chloride: 109 mmol/L (ref 98–111)
Creatinine, Ser: 0.63 mg/dL (ref 0.44–1.00)
GFR calc Af Amer: >60 mL/min (ref >60)
GLUCOSE: 88 mg/dL (ref 70–99)
Potassium: 2.9 mmol/L — ABNORMAL LOW (ref 3.5–5.1)
Sodium: 140 mmol/L (ref 135–145)

## 2018-05-16 LAB — MAGNESIUM: Magnesium: 1.5 mg/dL — ABNORMAL LOW (ref 1.7–2.4)

## 2018-05-16 SURGERY — APPENDECTOMY, LAPAROSCOPIC
Anesthesia: General

## 2018-05-16 MED ORDER — SUCCINYLCHOLINE CHLORIDE 20 MG/ML IJ SOLN
INTRAMUSCULAR | Status: DC | PRN
  Administered 2018-05-16: 100 mg via INTRAVENOUS

## 2018-05-16 MED ORDER — NOREPINEPHRINE BITARTRATE 1 MG/ML IV SOLN
INTRAVENOUS | Status: DC | PRN
  Administered 2018-05-16: .03 ug/kg/min via INTRAVENOUS

## 2018-05-16 MED ORDER — MORPHINE SULFATE (PF) 2 MG/ML IV SOLN
2.0000 mg | Freq: Once | INTRAVENOUS | Status: AC
  Administered 2018-05-16: 2 mg via INTRAVENOUS
  Filled 2018-05-16: qty 1

## 2018-05-16 MED ORDER — ONDANSETRON HCL 4 MG/2ML IJ SOLN
INTRAMUSCULAR | Status: DC | PRN
  Administered 2018-05-16: 4 mg via INTRAVENOUS

## 2018-05-16 MED ORDER — LACTATED RINGERS IV BOLUS
500.0000 mL | Freq: Once | INTRAVENOUS | Status: AC
  Administered 2018-05-16: 500 mL via INTRAVENOUS

## 2018-05-16 MED ORDER — VASOPRESSIN 20 UNIT/ML IV SOLN
INTRAVENOUS | Status: DC | PRN
  Administered 2018-05-16: 1 [IU] via INTRAVENOUS
  Administered 2018-05-16: .5 [IU] via INTRAVENOUS

## 2018-05-16 MED ORDER — ALBUMIN HUMAN 5 % IV SOLN
12.5000 g | Freq: Once | INTRAVENOUS | Status: AC
  Administered 2018-05-16: 12.5 g via INTRAVENOUS

## 2018-05-16 MED ORDER — FENTANYL CITRATE (PF) 100 MCG/2ML IJ SOLN
INTRAMUSCULAR | Status: AC
  Filled 2018-05-16: qty 2

## 2018-05-16 MED ORDER — KETOROLAC TROMETHAMINE 30 MG/ML IJ SOLN
INTRAMUSCULAR | Status: DC | PRN
  Administered 2018-05-16: 30 mg via INTRAVENOUS

## 2018-05-16 MED ORDER — DEXAMETHASONE SODIUM PHOSPHATE 10 MG/ML IJ SOLN
INTRAMUSCULAR | Status: AC
  Filled 2018-05-16: qty 1

## 2018-05-16 MED ORDER — ONDANSETRON HCL 4 MG/2ML IJ SOLN
INTRAMUSCULAR | Status: AC
  Filled 2018-05-16: qty 2

## 2018-05-16 MED ORDER — ALBUMIN HUMAN 5 % IV SOLN
INTRAVENOUS | Status: AC
  Administered 2018-05-16: 12.5 g via INTRAVENOUS
  Filled 2018-05-16: qty 250

## 2018-05-16 MED ORDER — KETOROLAC TROMETHAMINE 30 MG/ML IJ SOLN
INTRAMUSCULAR | Status: AC
  Filled 2018-05-16: qty 1

## 2018-05-16 MED ORDER — MIDAZOLAM HCL 2 MG/2ML IJ SOLN
INTRAMUSCULAR | Status: DC | PRN
  Administered 2018-05-16: 2 mg via INTRAVENOUS

## 2018-05-16 MED ORDER — VASOPRESSIN 20 UNIT/ML IV SOLN
INTRAVENOUS | Status: AC
  Filled 2018-05-16: qty 1

## 2018-05-16 MED ORDER — LIDOCAINE HCL (PF) 2 % IJ SOLN
INTRAMUSCULAR | Status: AC
  Filled 2018-05-16: qty 10

## 2018-05-16 MED ORDER — POTASSIUM CHLORIDE 10 MEQ/100ML IV SOLN
10.0000 meq | INTRAVENOUS | Status: AC
  Administered 2018-05-16 (×4): 10 meq via INTRAVENOUS
  Filled 2018-05-16 (×3): qty 100

## 2018-05-16 MED ORDER — SUGAMMADEX SODIUM 200 MG/2ML IV SOLN
INTRAVENOUS | Status: DC | PRN
  Administered 2018-05-16: 200 mg via INTRAVENOUS

## 2018-05-16 MED ORDER — ROCURONIUM BROMIDE 50 MG/5ML IV SOLN
INTRAVENOUS | Status: AC
  Filled 2018-05-16: qty 1

## 2018-05-16 MED ORDER — PROPOFOL 10 MG/ML IV BOLUS
INTRAVENOUS | Status: DC | PRN
  Administered 2018-05-16: 120 mg via INTRAVENOUS

## 2018-05-16 MED ORDER — PROPOFOL 10 MG/ML IV BOLUS
INTRAVENOUS | Status: AC
  Filled 2018-05-16: qty 20

## 2018-05-16 MED ORDER — MIDAZOLAM HCL 2 MG/2ML IJ SOLN
INTRAMUSCULAR | Status: AC
  Filled 2018-05-16: qty 2

## 2018-05-16 MED ORDER — BUPIVACAINE-EPINEPHRINE (PF) 0.5% -1:200000 IJ SOLN
INTRAMUSCULAR | Status: AC
  Filled 2018-05-16: qty 30

## 2018-05-16 MED ORDER — DEXAMETHASONE SODIUM PHOSPHATE 10 MG/ML IJ SOLN
INTRAMUSCULAR | Status: DC | PRN
  Administered 2018-05-16: 10 mg via INTRAVENOUS

## 2018-05-16 MED ORDER — PROMETHAZINE HCL 25 MG/ML IJ SOLN: 6.2500 mg | INTRAMUSCULAR | Status: DC | PRN

## 2018-05-16 MED ORDER — BUPIVACAINE-EPINEPHRINE (PF) 0.5% -1:200000 IJ SOLN
INTRAMUSCULAR | Status: DC | PRN
  Administered 2018-05-16: 30 mL

## 2018-05-16 MED ORDER — FENTANYL CITRATE (PF) 100 MCG/2ML IJ SOLN
25.0000 ug | INTRAMUSCULAR | Status: DC | PRN
  Administered 2018-05-16: 25 ug via INTRAVENOUS

## 2018-05-16 MED ORDER — LACTATED RINGERS IV SOLN
INTRAVENOUS | Status: DC | PRN
  Administered 2018-05-16: 16:00:00 via INTRAVENOUS

## 2018-05-16 MED ORDER — ROCURONIUM BROMIDE 100 MG/10ML IV SOLN
INTRAVENOUS | Status: DC | PRN
  Administered 2018-05-16: 5 mg via INTRAVENOUS
  Administered 2018-05-16: 10 mg via INTRAVENOUS
  Administered 2018-05-16 (×2): 5 mg via INTRAVENOUS
  Administered 2018-05-16: 20 mg via INTRAVENOUS

## 2018-05-16 MED ORDER — SUCCINYLCHOLINE CHLORIDE 20 MG/ML IJ SOLN
INTRAMUSCULAR | Status: AC
  Filled 2018-05-16: qty 1

## 2018-05-16 MED ORDER — LACTATED RINGERS IV SOLN
INTRAVENOUS | Status: DC | PRN
  Administered 2018-05-16 (×2): via INTRAVENOUS

## 2018-05-16 MED ORDER — FENTANYL CITRATE (PF) 100 MCG/2ML IJ SOLN
INTRAMUSCULAR | Status: AC
  Administered 2018-05-16: 25 ug via INTRAVENOUS
  Filled 2018-05-16: qty 2

## 2018-05-16 MED ORDER — SODIUM CHLORIDE FLUSH 0.9 % IV SOLN
INTRAVENOUS | Status: AC
  Filled 2018-05-16: qty 3

## 2018-05-16 MED ORDER — MAGNESIUM SULFATE 2 GM/50ML IV SOLN
2.0000 g | Freq: Once | INTRAVENOUS | Status: AC
  Administered 2018-05-16: 2 g via INTRAVENOUS
  Filled 2018-05-16: qty 50

## 2018-05-16 MED ORDER — PHENYLEPHRINE HCL 10 MG/ML IJ SOLN
INTRAMUSCULAR | Status: DC | PRN
  Administered 2018-05-16 (×3): 100 ug via INTRAVENOUS

## 2018-05-16 MED ORDER — FENTANYL CITRATE (PF) 100 MCG/2ML IJ SOLN
INTRAMUSCULAR | Status: DC | PRN
  Administered 2018-05-16 (×3): 25 ug via INTRAVENOUS
  Administered 2018-05-16: 50 ug via INTRAVENOUS

## 2018-05-16 MED ORDER — SUGAMMADEX SODIUM 200 MG/2ML IV SOLN
INTRAVENOUS | Status: AC
  Filled 2018-05-16: qty 2

## 2018-05-16 MED ORDER — KCL IN DEXTROSE-NACL 20-5-0.45 MEQ/L-%-% IV SOLN
INTRAVENOUS | Status: DC
  Administered 2018-05-16 – 2018-05-19 (×7): via INTRAVENOUS
  Filled 2018-05-16 (×10): qty 1000

## 2018-05-16 SURGICAL SUPPLY — 41 items
BULB RESERV EVAC DRAIN JP 100C (MISCELLANEOUS) ×6 IMPLANT
CANISTER SUCT 1200ML W/VALVE (MISCELLANEOUS) IMPLANT
CANISTER SUCT 3000ML (MISCELLANEOUS) ×3 IMPLANT
CHLORAPREP W/TINT 26ML (MISCELLANEOUS) ×3 IMPLANT
CUTTER FLEX LINEAR 45M (STAPLE) IMPLANT
DERMABOND ADVANCED (GAUZE/BANDAGES/DRESSINGS) ×2
DERMABOND ADVANCED .7 DNX12 (GAUZE/BANDAGES/DRESSINGS) ×1 IMPLANT
DRAIN CHANNEL JP 15F RND 16 (MISCELLANEOUS) ×6 IMPLANT
ELECT CAUTERY BLADE 6.4 (BLADE) ×3 IMPLANT
ELECT REM PT RETURN 9FT ADLT (ELECTROSURGICAL) ×3
ELECTRODE REM PT RTRN 9FT ADLT (ELECTROSURGICAL) ×1 IMPLANT
GLOVE SURG SYN 7.0 (GLOVE) ×15 IMPLANT
GLOVE SURG SYN 7.5  E (GLOVE) ×2
GLOVE SURG SYN 7.5 E (GLOVE) ×1 IMPLANT
GOWN STRL REUS W/ TWL LRG LVL3 (GOWN DISPOSABLE) ×2 IMPLANT
GOWN STRL REUS W/TWL LRG LVL3 (GOWN DISPOSABLE) ×4
IRRIGATION STRYKERFLOW (MISCELLANEOUS) ×1 IMPLANT
IRRIGATOR STRYKERFLOW (MISCELLANEOUS) ×3
IV NS 1000ML (IV SOLUTION) ×2
IV NS 1000ML BAXH (IV SOLUTION) ×1 IMPLANT
IV NS IRRIG 3000ML ARTHROMATIC (IV SOLUTION) ×3 IMPLANT
KIT TURNOVER KIT A (KITS) ×3 IMPLANT
LABEL OR SOLS (LABEL) ×3 IMPLANT
LIGASURE LAP MARYLAND 5MM 37CM (ELECTROSURGICAL) ×3 IMPLANT
NEEDLE HYPO 22GX1.5 SAFETY (NEEDLE) ×3 IMPLANT
NS IRRIG 500ML POUR BTL (IV SOLUTION) ×3 IMPLANT
PACK LAP CHOLECYSTECTOMY (MISCELLANEOUS) ×3 IMPLANT
PENCIL ELECTRO HAND CTR (MISCELLANEOUS) ×3 IMPLANT
POUCH SPECIMEN RETRIEVAL 10MM (ENDOMECHANICALS) ×3 IMPLANT
RELOAD 45 VASCULAR/THIN (ENDOMECHANICALS) IMPLANT
RELOAD STAPLE TA45 3.5 REG BLU (ENDOMECHANICALS) ×3 IMPLANT
SCISSORS METZENBAUM CVD 33 (INSTRUMENTS) ×3 IMPLANT
SLEEVE ADV FIXATION 5X100MM (TROCAR) ×6 IMPLANT
SUT MNCRL 4-0 (SUTURE) ×2
SUT MNCRL 4-0 27XMFL (SUTURE) ×1
SUT VICRYL 0 AB UR-6 (SUTURE) ×3 IMPLANT
SUTURE MNCRL 4-0 27XMF (SUTURE) ×1 IMPLANT
TRAY FOLEY MTR SLVR 16FR STAT (SET/KITS/TRAYS/PACK) ×3 IMPLANT
TROCAR BALLN GELPORT 12X130M (ENDOMECHANICALS) IMPLANT
TROCAR Z-THREAD OPTICAL 5X100M (TROCAR) ×3 IMPLANT
TUBING INSUFFLATION (TUBING) ×3 IMPLANT

## 2018-05-16 NOTE — Anesthesia Procedure Notes (Signed)
Procedure Name: Intubation Date/Time: 05/16/2018 4:23 PM Performed by: Sherrine Maples, CRNA Pre-anesthesia Checklist: Patient identified, Patient being monitored, Timeout performed, Emergency Drugs available and Suction available Patient Re-evaluated:Patient Re-evaluated prior to induction Oxygen Delivery Method: Circle system utilized Preoxygenation: Pre-oxygenation with 100% oxygen Induction Type: IV induction, Rapid sequence and Cricoid Pressure applied Laryngoscope Size: Miller and 2 Grade View: Grade I Tube type: Oral Tube size: 6.5 mm Number of attempts: 1 Airway Equipment and Method: Stylet Placement Confirmation: ETT inserted through vocal cords under direct vision,  positive ETCO2 and breath sounds checked- equal and bilateral Secured at: 21 cm Tube secured with: Tape Dental Injury: Teeth and Oropharynx as per pre-operative assessment

## 2018-05-16 NOTE — Progress Notes (Signed)
Patient states that last dose of dilaudid that was given at 1000 did not help and that she is having sharp pain. Dr. Hampton Abbot was paged and notified of this. He gave a one time order for morphine 2mg  IV to see if this medication will work better. Patient also has Toradol that is scheduled for 1200.

## 2018-05-16 NOTE — Plan of Care (Signed)
Nurse tech reports a blood pressure of 77/44. Heart rate is 87 and oxygen saturation is 100%. Patient remains alert and oriented. Dr. Hampton Abbot was paged and notified of this. Was also notified that patient states that her pain is not much better. An order was given for a 543ml bolus of IV fluids. Manual blood pressure was 80/54. Will recheck post bolus.

## 2018-05-16 NOTE — Progress Notes (Signed)
05/16/18 3:30 pm  Patient's clinical condition has deteriorated.  She has developed hypotension which improved with IV fluid bolus and now also some tachycardia and reports her pain is worse.  She does appear uncomfortable in bed.  Discussed with the patient and her family that we should go to the OR for laparoscopic appendectomy, possible laparotomy.  All would depend on the condition of tissue around the appendix for a clean staple line.  The patient is aware that we would also leave drains to help with the infection.  OR has been called and will go soon.  Olean Ree, MD

## 2018-05-16 NOTE — Transfer of Care (Signed)
Immediate Anesthesia Transfer of Care Note  Patient: Brittany Vaughn  Procedure(s) Performed: APPENDECTOMY LAPAROSCOPIC (N/A )  Patient Location: PACU  Anesthesia Type:General  Level of Consciousness: awake, alert  and oriented  Airway & Oxygen Therapy: Patient Spontanous Breathing and Patient connected to face mask oxygen  Post-op Assessment: Report given to RN and Post -op Vital signs reviewed and stable  Post vital signs: Reviewed and stable  Last Vitals:  Vitals Value Taken Time  BP 108/76 05/16/2018  6:30 PM  Temp 36.6 C 05/16/2018  6:30 PM  Pulse 114 05/16/2018  6:31 PM  Resp 0 05/16/2018  6:31 PM  SpO2 99 % 05/16/2018  6:31 PM  Vitals shown include unvalidated device data.  Last Pain:  Vitals:   05/16/18 1415  TempSrc: Oral  PainSc:       Patients Stated Pain Goal: 0 (05/39/76 7341)  Complications: No apparent anesthesia complications

## 2018-05-16 NOTE — Progress Notes (Signed)
Spoke with Dr. Hampton Abbot regarding patient's blood pressure as it had been running low over night. Last manual blood pressure was 94/60. She remains awake, alert and oriented. An order for a 540ml bolus of LR was given. Dr. Hampton Abbot plans to see the patient soon.

## 2018-05-16 NOTE — Anesthesia Preprocedure Evaluation (Addendum)
Anesthesia Evaluation  Patient identified by MRN, date of birth, ID band Patient awake    Reviewed: Allergy & Precautions, H&P , NPO status , Patient's Chart, lab work & pertinent test results  History of Anesthesia Complications Negative for: history of anesthetic complications  Airway Mallampati: III  TM Distance: >3 FB Neck ROM: full    Dental  (+) Teeth Intact   Pulmonary neg pulmonary ROS, neg shortness of breath,    breath sounds clear to auscultation       Cardiovascular (-) hypertension(-) anginanegative cardio ROS   Rhythm:regular Rate:Normal     Neuro/Psych  Headaches, PSYCHIATRIC DISORDERS Depression negative neurological ROS  negative psych ROS   GI/Hepatic negative GI ROS, Neg liver ROS,   Endo/Other  negative endocrine ROS  Renal/GU      Musculoskeletal   Abdominal   Peds  Hematology negative hematology ROS (+) anemia ,   Anesthesia Other Findings Pericarditis during pregnancy, normal echo Currently ruptured appendix with unstable vital signs, plan emergent to OR  Past Medical History: No date: Myopericarditis     Comment:  a. 10/2017 in setting of pregnancy-->chest pain w/ trop               of 4.91; b. 10/2017 Echo: EF 50-55%, no rwma.  History reviewed. No pertinent surgical history.  BMI    Body Mass Index:  25.75 kg/m      Reproductive/Obstetrics negative OB ROS                           Anesthesia Physical Anesthesia Plan  ASA: II and emergent  Anesthesia Plan: General ETT and Rapid Sequence   Post-op Pain Management:    Induction:   PONV Risk Score and Plan: Ondansetron, Dexamethasone and Midazolam  Airway Management Planned:   Additional Equipment:   Intra-op Plan:   Post-operative Plan:   Informed Consent: I have reviewed the patients History and Physical, chart, labs and discussed the procedure including the risks, benefits and alternatives  for the proposed anesthesia with the patient or authorized representative who has indicated his/her understanding and acceptance.   Dental Advisory Given  Plan Discussed with: Anesthesiologist, CRNA and Surgeon  Anesthesia Plan Comments:        Anesthesia Quick Evaluation

## 2018-05-16 NOTE — H&P (Signed)
Date of Admission:  05/15/2018  Reason for Admission:  Ruptured appendicitis  History of Present Illness: Brittany Vaughn is a 17 y.o. female presenting with a 2 day history of abdominal pain.  It started on 9/19 in the morning, in the low abdomen bilaterally and since then worsened and transitioned to the right lower quadrant.  She had nausea and vomiting at home.  She had a low grade fever in the ED of 100.2.  On workup, her WBC was 23.4.  She had a CT scan showing ruptured appendicitis, with bubbles of gas at the distal appendix, with significant periappendiceal and colonic inflammation.  She was admitted last night with plans for possible conservative management due to the significant inflammation, IV abx, and pain control.  Of note, she recently had a vaginal delivery about 5 week ago.  Past Medical History: Past Medical History:  Diagnosis Date  . Myopericarditis    a. 10/2017 in setting of pregnancy-->chest pain w/ trop of 4.91; b. 10/2017 Echo: EF 50-55%, no rwma.     Past Surgical History: --None.  Home Medications: Prior to Admission medications   Medication Sig Start Date End Date Taking? Authorizing Provider  medroxyPROGESTERone (DEPO-PROVERA) 150 MG/ML injection Inject 1 mL (150 mg total) into the muscle every 3 (three) months. 04/23/18  Yes Rod Can, CNM  ibuprofen (ADVIL,MOTRIN) 600 MG tablet Take 1 tablet (600 mg total) by mouth every 6 (six) hours. Patient not taking: Reported on 04/23/2018 04/05/18   Will Bonnet, MD  Prenatal Vit-Fe Fumarate-FA (PRENATAL MULTIVITAMIN) TABS tablet Take 1 tablet by mouth daily at 12 noon. Patient not taking: Reported on 04/23/2018 01/08/18   Gae Dry, MD    Allergies: Allergies  Allergen Reactions  . Tylenol [Acetaminophen]     Social History:  reports that she has never smoked. She has never used smokeless tobacco. She reports that she has current or past drug history. Drug: Marijuana. She reports that she does not  drink alcohol.   Family History: History of diabetes, hypertension in the family.  Review of Systems: Review of Systems  Constitutional: Positive for fever. Negative for chills.  HENT: Negative for hearing loss.   Eyes: Negative for blurred vision.  Respiratory: Negative for shortness of breath.   Cardiovascular: Negative for chest pain.  Gastrointestinal: Positive for abdominal pain, nausea and vomiting. Negative for constipation and diarrhea.  Genitourinary: Negative for dysuria.  Musculoskeletal: Negative for myalgias.  Skin: Negative for rash.  Neurological: Negative for dizziness.  Psychiatric/Behavioral: Negative for depression.    Physical Exam BP 98/60 (BP Location: Left Arm)   Pulse (!) 124   Temp 99.8 F (37.7 C) (Oral)   Resp 20   Ht 5\' 4"  (1.626 m)   Wt 68 kg   SpO2 99%   BMI 25.75 kg/m  CONSTITUTIONAL: No acute distress HEENT:  Normocephalic, atraumatic, extraocular motion intact. NECK: Trachea is midline, and there is no jugular venous distension.  RESPIRATORY:  Lungs are clear, and breath sounds are equal bilaterally. Normal respiratory effort without pathologic use of accessory muscles. CARDIOVASCULAR: Heart is regular without murmurs, gallops, or rubs. GI: The abdomen is soft, non-distended, with tenderness to palpation in the right lower quadrant, and some milder discomfort in the left side.  Non-toxic or peritoneal at this point.   MUSCULOSKELETAL:  Normal muscle strength and tone in all four extremities.  No peripheral edema or cyanosis. SKIN: Skin turgor is normal. There are no pathologic skin lesions.  NEUROLOGIC:  Motor and sensation  is grossly normal.  Cranial nerves are grossly intact. PSYCH:  Alert and oriented to person, place and time. Affect is normal.  Laboratory Analysis: Results for orders placed or performed during the hospital encounter of 05/15/18 (from the past 24 hour(s))  Lipase, blood     Status: None   Collection Time: 05/15/18   2:53 PM  Result Value Ref Range   Lipase 19 11 - 51 U/L  Comprehensive metabolic panel     Status: Abnormal   Collection Time: 05/15/18  2:53 PM  Result Value Ref Range   Sodium 138 135 - 145 mmol/L   Potassium 3.2 (L) 3.5 - 5.1 mmol/L   Chloride 104 98 - 111 mmol/L   CO2 22 22 - 32 mmol/L   Glucose, Bld 126 (H) 70 - 99 mg/dL   BUN 10 6 - 20 mg/dL   Creatinine, Ser 0.54 0.44 - 1.00 mg/dL   Calcium 8.8 (L) 8.9 - 10.3 mg/dL   Total Protein 7.6 6.5 - 8.1 g/dL   Albumin 4.0 3.5 - 5.0 g/dL   AST 14 (L) 15 - 41 U/L   ALT 12 0 - 44 U/L   Alkaline Phosphatase 88 38 - 126 U/L   Total Bilirubin 1.4 (H) 0.3 - 1.2 mg/dL   GFR calc non Af Amer >60 >60 mL/min   GFR calc Af Amer >60 >60 mL/min   Anion gap 12 5 - 15  CBC     Status: Abnormal   Collection Time: 05/15/18  2:53 PM  Result Value Ref Range   WBC 23.4 (H) 3.6 - 11.0 K/uL   RBC 4.53 3.80 - 5.20 MIL/uL   Hemoglobin 13.1 12.0 - 16.0 g/dL   HCT 37.4 35.0 - 47.0 %   MCV 82.5 80.0 - 100.0 fL   MCH 28.8 26.0 - 34.0 pg   MCHC 34.9 32.0 - 36.0 g/dL   RDW 13.3 11.5 - 14.5 %   Platelets 315 150 - 440 K/uL  Urinalysis, Complete w Microscopic     Status: Abnormal   Collection Time: 05/15/18  2:53 PM  Result Value Ref Range   Color, Urine YELLOW (A) YELLOW   APPearance HAZY (A) CLEAR   Specific Gravity, Urine 1.024 1.005 - 1.030   pH 5.0 5.0 - 8.0   Glucose, UA NEGATIVE NEGATIVE mg/dL   Hgb urine dipstick LARGE (A) NEGATIVE   Bilirubin Urine NEGATIVE NEGATIVE   Ketones, ur 80 (A) NEGATIVE mg/dL   Protein, ur 30 (A) NEGATIVE mg/dL   Nitrite NEGATIVE NEGATIVE   Leukocytes, UA NEGATIVE NEGATIVE   RBC / HPF 0-5 0 - 5 RBC/hpf   WBC, UA 6-10 0 - 5 WBC/hpf   Bacteria, UA NONE SEEN NONE SEEN   Squamous Epithelial / LPF 0-5 0 - 5   Mucus PRESENT   Pregnancy, urine POC     Status: None   Collection Time: 05/15/18  6:09 PM  Result Value Ref Range   Preg Test, Ur NEGATIVE NEGATIVE  Basic metabolic panel     Status: Abnormal   Collection  Time: 05/16/18  5:17 AM  Result Value Ref Range   Sodium 140 135 - 145 mmol/L   Potassium 2.9 (L) 3.5 - 5.1 mmol/L   Chloride 109 98 - 111 mmol/L   CO2 22 22 - 32 mmol/L   Glucose, Bld 88 70 - 99 mg/dL   BUN 10 6 - 20 mg/dL   Creatinine, Ser 0.63 0.44 - 1.00 mg/dL   Calcium 7.9 (L)  8.9 - 10.3 mg/dL   GFR calc non Af Amer >60 >60 mL/min   GFR calc Af Amer >60 >60 mL/min   Anion gap 9 5 - 15  Magnesium     Status: Abnormal   Collection Time: 05/16/18  5:17 AM  Result Value Ref Range   Magnesium 1.5 (L) 1.7 - 2.4 mg/dL  CBC WITH DIFFERENTIAL     Status: Abnormal   Collection Time: 05/16/18  5:17 AM  Result Value Ref Range   WBC 10.3 3.6 - 11.0 K/uL   RBC 3.87 3.80 - 5.20 MIL/uL   Hemoglobin 11.7 (L) 12.0 - 16.0 g/dL   HCT 32.9 (L) 35.0 - 47.0 %   MCV 84.9 80.0 - 100.0 fL   MCH 30.3 26.0 - 34.0 pg   MCHC 35.7 32.0 - 36.0 g/dL   RDW 13.4 11.5 - 14.5 %   Platelets 220 150 - 440 K/uL   Neutrophils Relative % 87 %   Neutro Abs 9.0 (H) 1.4 - 6.5 K/uL   Lymphocytes Relative 7 %   Lymphs Abs 0.7 (L) 1.0 - 3.6 K/uL   Monocytes Relative 6 %   Monocytes Absolute 0.6 0.2 - 0.9 K/uL   Eosinophils Relative 0 %   Eosinophils Absolute 0.0 0 - 0.7 K/uL   Basophils Relative 0 %   Basophils Absolute 0.0 0 - 0.1 K/uL    Imaging: Ct Abdomen Pelvis W Contrast  Result Date: 05/15/2018 CLINICAL DATA:  Abdominal pain since yesterday. Status post delivery 04/03/2018 EXAM: CT ABDOMEN AND PELVIS WITH CONTRAST TECHNIQUE: Multidetector CT imaging of the abdomen and pelvis was performed using the standard protocol following bolus administration of intravenous contrast. CONTRAST:  171mL ISOVUE-300 IOPAMIDOL (ISOVUE-300) INJECTION 61% COMPARISON:  None. FINDINGS: Lower chest: Unremarkable. Hepatobiliary: No focal abnormality within the liver parenchyma. There is no evidence for gallstones, gallbladder wall thickening, or pericholecystic fluid. No intrahepatic or extrahepatic biliary dilation. Pancreas: No  focal mass lesion. No dilatation of the main duct. No intraparenchymal cyst. No peripancreatic edema. Spleen: No splenomegaly. No focal mass lesion. Adrenals/Urinary Tract: No adrenal nodule or mass. Kidneys are unremarkable. No evidence for hydroureter. The urinary bladder appears normal for the degree of distention. Stomach/Bowel: Stomach is nondistended. No gastric wall thickening. No evidence of outlet obstruction. Duodenum is normally positioned as is the ligament of Treitz. No small bowel wall thickening. No small bowel dilatation. The terminal ileum is normal. The appendix is markedly dilated up to 16 mm diameter. There is substantial pericolonic edema/inflammation. 10 x 4 mm appendicoliths is identified towards the base of the appendix with the mid and tip of the appendix beyond the appendicoliths markedly abnormal. Coronal image 34 series 5 clearly demonstrates extraluminal gas at the tip of the appendix, also visible on sagittal image 44 of series 6. Fluid is noted around the tip of the appendix with fluid noted in the right adnexal space, cul-de-sac, and in the space between the bladder dome and the anterior uterus (sagittal image 54/series 6). No gross colonic mass. No colonic wall thickening. No substantial diverticular change. Vascular/Lymphatic: No abdominal aortic aneurysm. No abdominal aortic atherosclerotic calcification. There is no gastrohepatic or hepatoduodenal ligament lymphadenopathy. No intraperitoneal or retroperitoneal lymphadenopathy. No pelvic sidewall lymphadenopathy. Reproductive: Dominant follicle noted in both ovaries. Other: Free fluid in the pelvis, as described above. Musculoskeletal: No worrisome lytic or sclerotic osseous abnormality. 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 IMPRESSION: 1. Ruptured appendicitis with appendicolith near the base of the appendix. Free gas and free  fluid is seen in the pelvis but no discrete rim enhancing abscess evident at this time. I  called these results at the time of interpretation on 05/15/2018 at 6:33 pm to Dr. Carrie Mew , who verbally acknowledged these results. Electronically Signed   By: Misty Stanley M.D.   On: 05/15/2018 18:34    Assessment and Plan: This is a 17 y.o. female with ruptured appendicitis.  I have independently viewed the patient's imaging study and reviewed her laboratory studies.  Overall, her CT scan does show a ruptured appendix distally, with no abscess at the time.  There is a lot of inflammation around the appendix and colon.  Discussed with the patient and her family that given all the severe inflammation, I would like to try conservative management of her ruptured appendicitis with NPO diet, IV fluid hydration, IV antibiotics, and appropriate pain control.  Discussed with her that going to the OR last night would have proven to be very difficult and due to all the inflammation, she would be at a higher risk of needing a more extensive surgery to be able to staple in healthy tissue margins.  Discussed with her that if there is no improvement, such as worsening pain, worsening WBC, fevers, altered vital signs, then we may have to indeed to go the operating room, and possibly require a more extensive surgery than an appendectomy.  There is also the possibility of her requiring a drain if an abscess does form.  If conservative measures work, then we would plan for interval appendectomy in 6-8 weeks.  Patient understands this plan and all of her questions have been answered.   Melvyn Neth, MD [facility] Pg:  [phone]

## 2018-05-16 NOTE — Op Note (Signed)
  Procedure Date:  05/16/2018  Pre-operative Diagnosis:  Ruptured appendicitis  Post-operative Diagnosis:  Ruptured appendicitis  Procedure:  Laparoscopic appendectomy  Surgeon:  Melvyn Neth, MD  Anesthesia:  General endotracheal  Estimated Blood Loss:  20 ml  Specimens:  appendix  Complications:  None  Indications for Procedure:  This is a 17 y.o. female who presents with abdominal pain and workup revealing ruptured acute appendicitis.  She failed conservative management and the possibility of a more extensive surgery was reviewed with the patient.  The risks of bleeding, infection, recurrence of symptoms, potential for an open procedure, bowel injury, abscess or infection, were all discussed with the patient and she was willing to proceed.  Description of Procedure: The patient was correctly identified in the preoperative area and brought into the operating room.  The patient was placed supine with VTE prophylaxis in place.  Appropriate time-outs were performed.  Anesthesia was induced and the patient was intubated.  Foley catheter was placed.  Appropriate antibiotics were infused.  The abdomen was prepped and draped in a sterile fashion. An infraumbilical incision was made. A cutdown technique was used to enter the abdominal cavity without injury, and a Hasson trocar was inserted.  Pneumoperitoneum was obtained with appropriate opening pressures.  Two 5-mm ports were placed in the suprapubic and left lateral positions under direct visualization.  The right lower quadrant was inspected and there was significant inflammatory response, with purulent fluid in the right lower quadrant and pelvis.  The appendix itself was gangrenous.  The appendix was carefully dissected.  The mesoappendix was divided using the LigaSure.  The base of the appendix was dissected out and divided with a standard load Endo GIA.  The appendix was placed in an Endocatch bag.  The right lower quadrant was then  inspected again revealing an intact staple line, no bleeding, and no bowel injury.  We proceeded to irrigate all four quadrants thoroughly as well as the pelvis.  Two 15 Fr. Blake drains were placed through the 5 mm ports -- low abdominal drain going to the pelvis and left lateral drain going to the right lower quadrant.  The 5 mm ports were removed under direct visualization and the Hasson trocar was removed.  The Endocatch bag was brought out through the umbilical incision.  The fascial opening was closed using 0 vicryl suture x 3.  The two drains were secured to the skin using 3-0 Nylon. Local anesthetic was infused in all incisions and the incisions were closed with staples. The wounds were cleaned and dressed with 4x4 gauze and tegaderm.  The patient was emerged from anesthesia and extubated and brought to the recovery room for further management.  The patient tolerated the procedure well and all counts were correct at the end of the case.   Melvyn Neth, MD

## 2018-05-16 NOTE — Anesthesia Post-op Follow-up Note (Signed)
Anesthesia QCDR form completed.        

## 2018-05-17 LAB — CBC WITH DIFFERENTIAL/PLATELET
Basophils Absolute: 0 10*3/uL (ref 0–0.1)
Basophils Relative: 0 %
Eosinophils Absolute: 0 10*3/uL (ref 0–0.7)
Eosinophils Relative: 0 %
HEMATOCRIT: 27.4 % — AB (ref 35.0–47.0)
Hemoglobin: 9.8 g/dL — ABNORMAL LOW (ref 12.0–16.0)
LYMPHS ABS: 0.4 10*3/uL — AB (ref 1.0–3.6)
LYMPHS PCT: 4 %
MCH: 30.6 pg (ref 26.0–34.0)
MCHC: 35.6 g/dL (ref 32.0–36.0)
MCV: 86 fL (ref 80.0–100.0)
MONOS PCT: 5 %
Monocytes Absolute: 0.6 10*3/uL (ref 0.2–0.9)
NEUTROS PCT: 91 %
Neutro Abs: 9.4 10*3/uL — ABNORMAL HIGH (ref 1.4–6.5)
Platelets: 154 10*3/uL (ref 150–440)
RBC: 3.18 MIL/uL — AB (ref 3.80–5.20)
RDW: 13.6 % (ref 11.5–14.5)
WBC: 10.4 10*3/uL (ref 3.6–11.0)

## 2018-05-17 LAB — BASIC METABOLIC PANEL
Anion gap: 5 (ref 5–15)
BUN: 10 mg/dL (ref 6–20)
CO2: 22 mmol/L (ref 22–32)
Calcium: 8 mg/dL — ABNORMAL LOW (ref 8.9–10.3)
Chloride: 111 mmol/L (ref 98–111)
Creatinine, Ser: 0.56 mg/dL (ref 0.44–1.00)
GFR calc Af Amer: >60 mL/min (ref >60)
GFR calc non Af Amer: >60 mL/min (ref >60)
GLUCOSE: 147 mg/dL — AB (ref 70–99)
POTASSIUM: 4.3 mmol/L (ref 3.5–5.1)
SODIUM: 138 mmol/L (ref 135–145)

## 2018-05-17 LAB — MAGNESIUM: MAGNESIUM: 2 mg/dL (ref 1.7–2.4)

## 2018-05-17 MED ORDER — OXYCODONE HCL 5 MG PO TABS: 5.0000 mg | ORAL_TABLET | ORAL | Status: DC | PRN

## 2018-05-17 MED ORDER — OXYCODONE HCL 5 MG PO TABS
5.0000 mg | ORAL_TABLET | ORAL | Status: DC | PRN
  Administered 2018-05-17: 10 mg via ORAL
  Administered 2018-05-17: 5 mg via ORAL
  Administered 2018-05-18: 10 mg via ORAL
  Administered 2018-05-18 (×2): 5 mg via ORAL
  Administered 2018-05-19: 10 mg via ORAL
  Filled 2018-05-17 (×2): qty 1
  Filled 2018-05-17: qty 2
  Filled 2018-05-17: qty 1
  Filled 2018-05-17 (×2): qty 2

## 2018-05-17 NOTE — Progress Notes (Signed)
05/17/2018  Subjective: Patient is 1 Day Post-Op s/p lap appy for ruptured appendicitis.  Two Blake drains were left in the abdomen for drainage.  No acute events overnight.  Reports her pain is better.  Denies any nausea or vomiting.  Vital signs: Temp:  [97.3 F (36.3 C)-98.5 F (36.9 C)] 98.5 F (36.9 C) (09/22 0527) Pulse Rate:  [60-134] 62 (09/22 0527) Resp:  [11-26] 18 (09/22 0527) BP: (77-108)/(44-76) 100/71 (09/22 0527) SpO2:  [90 %-100 %] 100 % (09/22 0527)   Intake/Output: 09/21 0701 - 09/22 0700 In: 6280.1 [I.V.:5500; IV Piggyback:680.1] Out: 1901 [Urine:1200; Drains:350; Blood:20] Last BM Date: 05/15/18  Physical Exam: Constitutional: No acute distress Abdomen:  Soft, nondistended, appropriately tender to palpation.  Incisions with dressings with some serosanguinous saturation.  JP drains with serosanguinous fluid.  Labs:  Recent Labs    05/16/18 0517 05/17/18 0430  WBC 10.3 10.4  HGB 11.7* 9.8*  HCT 32.9* 27.4*  PLT 220 154   Recent Labs    05/16/18 0517 05/17/18 0430  NA 140 138  K 2.9* 4.3  CL 109 111  CO2 22 22  GLUCOSE 88 147*  BUN 10 10  CREATININE 0.63 0.56  CALCIUM 7.9* 8.0*   No results for input(s): LABPROT, INR in the last 72 hours.  Imaging: No results found.  Assessment/Plan: This is a 17 y.o. female s/p lap appy.  --D/C foley catheter as UOP has improved. --Continue IV fluids and IV antibiotics --Start clear liquid diet --Continue JP drains --OOB, ambulate   Melvyn Neth, [place]

## 2018-05-18 ENCOUNTER — Encounter: Payer: Self-pay | Admitting: Surgery

## 2018-05-18 LAB — CBC WITH DIFFERENTIAL/PLATELET
BASOS PCT: 0 %
Basophils Absolute: 0 10*3/uL (ref 0–0.1)
Eosinophils Absolute: 0 10*3/uL (ref 0–0.7)
Eosinophils Relative: 0 %
HEMATOCRIT: 28.5 % — AB (ref 35.0–47.0)
HEMOGLOBIN: 9.6 g/dL — AB (ref 12.0–16.0)
LYMPHS PCT: 11 %
Lymphs Abs: 1.6 10*3/uL (ref 1.0–3.6)
MCH: 28.8 pg (ref 26.0–34.0)
MCHC: 33.8 g/dL (ref 32.0–36.0)
MCV: 85.1 fL (ref 80.0–100.0)
MONO ABS: 0.9 10*3/uL (ref 0.2–0.9)
Monocytes Relative: 6 %
NEUTROS PCT: 83 %
Neutro Abs: 12.3 10*3/uL — ABNORMAL HIGH (ref 1.4–6.5)
Platelets: 198 10*3/uL (ref 150–440)
RBC: 3.35 MIL/uL — ABNORMAL LOW (ref 3.80–5.20)
RDW: 13.8 % (ref 11.5–14.5)
WBC: 14.9 10*3/uL — ABNORMAL HIGH (ref 3.6–11.0)

## 2018-05-18 LAB — BASIC METABOLIC PANEL
ANION GAP: 3 — AB (ref 5–15)
BUN: 9 mg/dL (ref 6–20)
CHLORIDE: 112 mmol/L — AB (ref 98–111)
CO2: 22 mmol/L (ref 22–32)
Calcium: 8 mg/dL — ABNORMAL LOW (ref 8.9–10.3)
Creatinine, Ser: 0.52 mg/dL (ref 0.44–1.00)
GFR calc Af Amer: >60 mL/min (ref >60)
GFR calc non Af Amer: >60 mL/min (ref >60)
GLUCOSE: 149 mg/dL — AB (ref 70–99)
POTASSIUM: 3.7 mmol/L (ref 3.5–5.1)
Sodium: 137 mmol/L (ref 135–145)

## 2018-05-18 LAB — MAGNESIUM: Magnesium: 2 mg/dL (ref 1.7–2.4)

## 2018-05-18 MED ORDER — FAMOTIDINE 20 MG PO TABS
20.0000 mg | ORAL_TABLET | Freq: Two times a day (BID) | ORAL | Status: DC
  Administered 2018-05-18 – 2018-05-19 (×2): 20 mg via ORAL
  Filled 2018-05-18 (×2): qty 1

## 2018-05-18 NOTE — Progress Notes (Signed)
PHARMACIST - PHYSICIAN COMMUNICATION  DR:   Hampton Abbot  CONCERNING: IV to Oral Route Change Policy  RECOMMENDATION: This patient is receiving famotidine by the intravenous route.  Based on criteria approved by the Pharmacy and Therapeutics Committee, the intravenous medication(s) is/are being converted to the equivalent oral dose form(s).   DESCRIPTION: These criteria include:  The patient is eating (either orally or via tube) and/or has been taking other orally administered medications for a least 24 hours  The patient has no evidence of active gastrointestinal bleeding or impaired GI absorption (gastrectomy, short bowel, patient on TNA or NPO).  If you have questions about this conversion, please contact the Pharmacy Department  []   [phone] )  Brittany Vaughn [x]   [phone] )  [facility] []   [phone] )  [facility] []   [phone] )  [facility] []   [phone] )  [place], [facility] 05/18/2018 1:45 PM

## 2018-05-18 NOTE — Progress Notes (Signed)
[facility] Progress Note  2 Days Post-Op  Subjective: She is resting comfortably in bed this morning, complaints of abdominal soreness worse with movement. Tolerating liquid diet without nausea or emesis. No fevers. Endorses flatus but no bowel movement.   Objective: Vital signs in last 24 hours: Temp:  [96.6 F (35.9 C)-98.3 F (36.8 C)] 97.4 F (36.3 C) (09/23 0445) Pulse Rate:  [55-73] 73 (09/23 0445) Resp:  [16-20] 20 (09/23 0445) BP: (99-121)/(73-88) 121/88 (09/23 0445) SpO2:  [92 %-100 %] 92 % (09/23 0445) Last BM Date: 05/15/18  Intake/Output from previous day: 09/22 0701 - 09/23 0700 In: 2937.8 [I.V.:2682.4; IV Piggyback:255.3] Out: 490 [Urine:150; Drains:340] Intake/Output this shift: No intake/output data recorded.  PE: Gen:  Alert, NAD, pleasant Pulm:  Normal effort Abd: Soft, tenderness to LLQ near incisions, non-distended, Incisions CDI without erythema, JP drain in LLQ with serous fluid present Skin: warm and dry, no rashes  Psych: A&Ox3   Lab Results:  Recent Labs    05/17/18 0430 05/18/18 0431  WBC 10.4 14.9*  HGB 9.8* 9.6*  HCT 27.4* 28.5*  PLT 154 198   BMET Recent Labs    05/17/18 0430 05/18/18 0431  NA 138 137  K 4.3 3.7  CL 111 112*  CO2 22 22  GLUCOSE 147* 149*  BUN 10 9  CREATININE 0.56 0.52  CALCIUM 8.0* 8.0*   PT/INR No results for input(s): LABPROT, INR in the last 72 hours. CMP     Component Value Date/Time   NA 137 05/18/2018 0431   K 3.7 05/18/2018 0431   CL 112 (H) 05/18/2018 0431   CO2 22 05/18/2018 0431   GLUCOSE 149 (H) 05/18/2018 0431   BUN 9 05/18/2018 0431   CREATININE 0.52 05/18/2018 0431   CALCIUM 8.0 (L) 05/18/2018 0431   PROT 7.6 05/15/2018 1453   ALBUMIN 4.0 05/15/2018 1453   AST 14 (L) 05/15/2018 1453   ALT 12 05/15/2018 1453   ALKPHOS 88 05/15/2018 1453   BILITOT 1.4 (H) 05/15/2018 1453   GFRNONAA >60 05/18/2018 0431   GFRAA >60 05/18/2018 0431   Lipase     Component Value  Date/Time   LIPASE 19 05/15/2018 1453       Studies/Results: No results found.  Anti-infectives: Anti-infectives (From admission, onward)   Start     Dose/Rate Route Frequency Ordered Stop   05/16/18 0100  piperacillin-tazobactam (ZOSYN) IVPB 3.375 g     3.375 g 12.5 mL/hr over 240 Minutes Intravenous Every 8 hours 05/15/18 1900     05/15/18 1845  piperacillin-tazobactam (ZOSYN) IVPB 3.375 g     3.375 g 100 mL/hr over 30 Minutes Intravenous  Once 05/15/18 1835 05/15/18 1917       Assessment/Plan  Acute Perforated Appendicitis  - POD2, doing well today with complaints of abdominal soreness. Tolerating clears without nausea or emesis and has passed flatus.  - Will advance to soft diet this morning and if tolerates advance further - IVF to 75 ml/hr this morning, d/c these as her diet advances - WBC to 14.9 this morning on CBC, will continue to monitor while in hospital  - Continue JP Drains, ~340 ccs out in 24 hours - Continue IVF - DVT Prophylaxis - Mobilize  - Maybe home in 24-48 hours.    LOS: 3 days    Edison Simon , PA-C [facility] 05/18/2018, 8:13 AM [phone] M-F: 7am - 4pm

## 2018-05-19 LAB — CBC
HCT: 29.2 % — ABNORMAL LOW (ref 35.0–47.0)
HEMOGLOBIN: 10.2 g/dL — AB (ref 12.0–16.0)
MCH: 29.4 pg (ref 26.0–34.0)
MCHC: 34.9 g/dL (ref 32.0–36.0)
MCV: 84.1 fL (ref 80.0–100.0)
PLATELETS: 291 10*3/uL (ref 150–440)
RBC: 3.47 MIL/uL — ABNORMAL LOW (ref 3.80–5.20)
RDW: 13.8 % (ref 11.5–14.5)
WBC: 9.9 10*3/uL (ref 3.6–11.0)

## 2018-05-19 MED ORDER — AMOXICILLIN-POT CLAVULANATE 875-125 MG PO TABS: 1.0000 | ORAL_TABLET | Freq: Two times a day (BID) | ORAL | 0 refills | Status: AC

## 2018-05-19 MED ORDER — OXYCODONE HCL 5 MG PO TABS: 5.0000 mg | ORAL_TABLET | Freq: Four times a day (QID) | ORAL | 0 refills | Status: DC | PRN

## 2018-05-19 NOTE — Anesthesia Postprocedure Evaluation (Deleted)
Anesthesia Post Note  Patient: Brittany Vaughn  Procedure(s) Performed: APPENDECTOMY LAPAROSCOPIC (N/A )  Anesthesia Type: General     Last Vitals:  Vitals:   05/18/18 1314 05/18/18 1953  BP: 102/77 105/79  Pulse: 60 84  Resp: 20 20  Temp: 36.4 C 37 C  SpO2: 97% 98%    Last Pain:  Vitals:   05/18/18 2207  TempSrc:   PainSc: [place]

## 2018-05-19 NOTE — Discharge Summary (Signed)
Discharge Summary  Patient ID: Brittany Vaughn MRN: [id number] DOB/AGE: 31-Dec-2000 17 y.o.  Admit date: 05/15/2018 Discharge date: 05/19/2018  Discharge Diagnoses Ruptured Appendicitis   Consultants None  Procedures Laparoscopic Appendectomy on 05/16/18 with Dr. Hampton Abbot  HPI: Brittany Vaughn is a 17 y.o. female presenting with a 2 day history of abdominal pain. It started on 9/19 in the morning, in the low abdomen bilaterally and since then worsened and transitioned to the right lower quadrant. She had nausea and vomiting at home. She had a low grade fever in the ED of 100.2. On workup, her WBC was 23.4. She had a CT scan showing ruptured appendicitis, with bubbles of gas at the distal appendix, with significant periappendiceal and colonic inflammation. She was admitted last night with plans for possible conservative management due to the significant inflammation, IV abx, and pain control.  Hospital Course: She was admitted on 09/21 with plans for conservative management with IV ABx, however, later in the same day her clinical condition continued to decline. The decision was made to undergo laparoscopic appendectomy that afternoon, which she tolerated well and required two drains to remain in place. Her post-operatively period was unremarkable. Her pain remained under control, her diet was advanced with out adverse events, and she was able to mobilize. On the day of discharge (05/19/18), she was tolerating a diet, her pain was controlled, and she was mobilizing. She understands that she will go home with her two JP drains as well as a prescription for ABx and pain control. All of her questions and concerns were addressed and answered.   Physical Exam:  Const: Well appearing female, NAD Pulm: CTAB, no wheezes Card: HRRR, no m/r/g Abdomen: Soft, mild tenderness on the left, non-distended, JP drains in LLQ and suprapubic region with serous fluid, laparoscopic incisions are healing well without erythema.   MSK: No peripheral edema.  Pysch: A&Ox3    Allergies as of 05/19/2018      Reactions   Tylenol [acetaminophen] Other (See Comments)   Headache.      Medication List    TAKE these medications   amoxicillin-clavulanate 875-125 MG tablet Commonly known as:  AUGMENTIN Take 1 tablet by mouth 2 (two) times daily for 10 days.   medroxyPROGESTERone 150 MG/ML injection Commonly known as:  DEPO-PROVERA Inject 1 mL (150 mg total) into the muscle every 3 (three) months.   oxyCODONE 5 MG immediate release tablet Commonly known as:  Oxy IR/ROXICODONE Take 1 tablet (5 mg total) by mouth every 6 (six) hours as needed for severe pain.        Follow-up Information    Vickie Epley, MD. Schedule an appointment as soon as possible for a visit in 1 week(s).   Specialty:  General Surgery Why:  1 week f/u s/p lap appy with Dr. Hampton Abbot, drains and umbilical staples to be removed at this appointment Contact information: [address] [place] [address] [phone]           Signed: Edison Simon , PA-C [facility]  05/19/2018, 1:31 PM [phone] M-F: 7am - 4pm

## 2018-05-19 NOTE — Progress Notes (Signed)
Louanna Raw  A and O x 4. VSS. Pt tolerating diet well. No complaints of pain or nausea. IV removed intact, prescriptions given. Pt voiced understanding of discharge instructions with no further questions. Pt discharged via wheelchair with axillary.    Allergies as of 05/19/2018      Reactions   Tylenol [acetaminophen] Other (See Comments)   Headache.      Medication List    TAKE these medications   amoxicillin-clavulanate 875-125 MG tablet Commonly known as:  AUGMENTIN Take 1 tablet by mouth 2 (two) times daily for 10 days.   medroxyPROGESTERone 150 MG/ML injection Commonly known as:  DEPO-PROVERA Inject 1 mL (150 mg total) into the muscle every 3 (three) months.   oxyCODONE 5 MG immediate release tablet Commonly known as:  Oxy IR/ROXICODONE Take 1 tablet (5 mg total) by mouth every 6 (six) hours as needed for severe pain.       Vitals:   05/18/18 1953 05/19/18 1145  BP: 105/79 106/76  Pulse: 84 84  Resp: 20 16  Temp: 98.6 F (37 C) 98.1 F (36.7 C)  SpO2: 98% 97%    Francesco Sor

## 2018-05-19 NOTE — Care Management (Signed)
Patient status post Laparoscopic Appendectomy .  Patient to discharge today with antibiotics and pain medication.  Patient is uninsured.  Prescription for antibiotic faxed to [facility] .  Patient to pick up at no cost after discharge.  Patient to fill prescription for pain medication at pharmacy of her choice. Patient states that she does not have a PCP.  She has been going to er post partum appointments with her OBGYN. Patient provided application to [facility] ,[facility] .  Follow up appointment has been scheduled with surgery. Patient denies issues with transportation.  RNCM signing off.

## 2018-05-19 NOTE — Anesthesia Postprocedure Evaluation (Addendum)
Anesthesia Post Note  Patient: Brittany Vaughn  Procedure(s) Performed: APPENDECTOMY LAPAROSCOPIC (N/A )  Patient location during evaluation: PACU Anesthesia Type: General Level of consciousness: awake and alert Pain management: pain level controlled Vital Signs Assessment: post-procedure vital signs reviewed and stable Respiratory status: spontaneous breathing, nonlabored ventilation, respiratory function stable and patient connected to nasal cannula oxygen Cardiovascular status: blood pressure returned to baseline and stable Postop Assessment: no apparent nausea or vomiting Anesthetic complications: no     Last Vitals:  Vitals:   05/18/18 1314 05/18/18 1953  BP: 102/77 105/79  Pulse: 60 84  Resp: 20 20  Temp: 36.4 C 37 C  SpO2: 97% 98%    Last Pain:  Vitals:   05/18/18 2207  TempSrc:   PainSc: [place]

## 2018-05-20 ENCOUNTER — Encounter: Payer: Self-pay | Admitting: Emergency Medicine

## 2018-05-20 ENCOUNTER — Other Ambulatory Visit: Payer: Self-pay

## 2018-05-20 ENCOUNTER — Emergency Department
Admission: EM | Admit: 2018-05-20 | Discharge: 2018-05-21 | Disposition: A | Discharge status: Home / Self Care | Payer: Medicaid Other | Attending: Emergency Medicine | Admitting: Emergency Medicine

## 2018-05-20 ENCOUNTER — Telehealth: Payer: Self-pay

## 2018-05-20 DIAGNOSIS — G8918 Other acute postprocedural pain: Secondary | ICD-10-CM | POA: Insufficient documentation

## 2018-05-20 DIAGNOSIS — Z79899 Other long term (current) drug therapy: Secondary | ICD-10-CM | POA: Diagnosis not present

## 2018-05-20 DIAGNOSIS — R1033 Periumbilical pain: Secondary | ICD-10-CM | POA: Diagnosis not present

## 2018-05-20 DIAGNOSIS — Z5189 Encounter for other specified aftercare: Secondary | ICD-10-CM

## 2018-05-20 LAB — CBC WITH DIFFERENTIAL/PLATELET
BASOS PCT: 0 %
Basophils Absolute: 0 10*3/uL (ref 0–0.1)
EOS ABS: 0.2 10*3/uL (ref 0–0.7)
EOS PCT: 2 %
HCT: 31 % — ABNORMAL LOW (ref 35.0–47.0)
Hemoglobin: 10.8 g/dL — ABNORMAL LOW (ref 12.0–16.0)
LYMPHS ABS: 1.8 10*3/uL (ref 1.0–3.6)
Lymphocytes Relative: 16 %
MCH: 29.1 pg (ref 26.0–34.0)
MCHC: 34.9 g/dL (ref 32.0–36.0)
MCV: 83.3 fL (ref 80.0–100.0)
MONOS PCT: 14 %
Monocytes Absolute: 1.5 10*3/uL — ABNORMAL HIGH (ref 0.2–0.9)
NEUTROS PCT: 68 %
Neutro Abs: 7.3 10*3/uL — ABNORMAL HIGH (ref 1.4–6.5)
PLATELETS: 356 10*3/uL (ref 150–440)
RBC: 3.72 MIL/uL — ABNORMAL LOW (ref 3.80–5.20)
RDW: 13.7 % (ref 11.5–14.5)
WBC: 10.8 10*3/uL (ref 3.6–11.0)

## 2018-05-20 LAB — COMPREHENSIVE METABOLIC PANEL
ALK PHOS: 75 U/L (ref 38–126)
ALT: 15 U/L (ref 0–44)
AST: 15 U/L (ref 15–41)
Albumin: 3 g/dL — ABNORMAL LOW (ref 3.5–5.0)
Anion gap: 10 (ref 5–15)
BUN: 6 mg/dL (ref 6–20)
CALCIUM: 8.5 mg/dL — AB (ref 8.9–10.3)
CHLORIDE: 105 mmol/L (ref 98–111)
CO2: 24 mmol/L (ref 22–32)
CREATININE: 0.53 mg/dL (ref 0.44–1.00)
Glucose, Bld: 78 mg/dL (ref 70–99)
Potassium: 3.4 mmol/L — ABNORMAL LOW (ref 3.5–5.1)
Sodium: 139 mmol/L (ref 135–145)
Total Bilirubin: 1.3 mg/dL — ABNORMAL HIGH (ref 0.3–1.2)
Total Protein: 6.6 g/dL (ref 6.5–8.1)

## 2018-05-20 LAB — URINALYSIS, COMPLETE (UACMP) WITH MICROSCOPIC
Bacteria, UA: NONE SEEN
Bilirubin Urine: NEGATIVE
GLUCOSE, UA: NEGATIVE mg/dL
Ketones, ur: 80 mg/dL — AB
Leukocytes, UA: NEGATIVE
NITRITE: NEGATIVE
PH: 7 (ref 5.0–8.0)
PROTEIN: NEGATIVE mg/dL
RBC / HPF: >50 RBC/hpf — ABNORMAL HIGH (ref 0–5)
SPECIFIC GRAVITY, URINE: 1.012 (ref 1.005–1.030)

## 2018-05-20 LAB — LACTIC ACID, PLASMA: Lactic Acid, Venous: 0.6 mmol/L (ref 0.5–1.9)

## 2018-05-20 LAB — POC URINE PREG, ED: Preg Test, Ur: NEGATIVE — NL

## 2018-05-20 LAB — SURGICAL PATHOLOGY

## 2018-05-20 MED ORDER — IOPAMIDOL (ISOVUE-300) INJECTION 61%
30.0000 mL | Freq: Once | INTRAVENOUS | Status: AC
  Administered 2018-05-20: 30 mL via ORAL

## 2018-05-20 MED ORDER — MORPHINE SULFATE (PF) 4 MG/ML IV SOLN
4.0000 mg | Freq: Once | INTRAVENOUS | Status: AC
  Administered 2018-05-20: 4 mg via INTRAVENOUS
  Filled 2018-05-20: qty 1

## 2018-05-20 MED ORDER — ONDANSETRON HCL 4 MG/2ML IJ SOLN
4.0000 mg | Freq: Once | INTRAMUSCULAR | Status: AC
  Administered 2018-05-20: 4 mg via INTRAVENOUS
  Filled 2018-05-20: qty 2

## 2018-05-20 MED ORDER — SODIUM CHLORIDE 0.9 % IV BOLUS
1000.0000 mL | Freq: Once | INTRAVENOUS | Status: AC
  Administered 2018-05-20: 1000 mL via INTRAVENOUS

## 2018-05-20 NOTE — ED Triage Notes (Signed)
Patient reports having appendectomy on Saturday and dischared with JP drains in place yesterday. Reports today noticing bleeding and drainage from belly button around staples and change in color of drainage in tubes. Patient denies fever.

## 2018-05-20 NOTE — ED Provider Notes (Signed)
[facility] Emergency Department Provider Note  Time seen: 10:14 PM  I have reviewed the triage vital signs and the nursing notes.   HISTORY  Chief Complaint Post-op Problem    HPI Brittany Vaughn is a 17 y.o. female with a past medical history of ruptured appendicitis status post appendectomy 05/16/2018 ultimately discharged with 2 JP drains, currently on antibiotics presents to the emergency department for worsening lower abdominal pain and discharge from her umbilical incision.  According to the patient over the past 2 days she has had worsening abdominal pain, she was discharged yesterday.  States today she noticed drainage from her umbilical incision as well.  Denies any fever.  Was concerned so she came to the emergency department for evaluation.  Describes the pain as a 8/10 pain across her lower abdomen currently.   Past Medical History:  Diagnosis Date  . Myopericarditis    a. 10/2017 in setting of pregnancy-->chest pain w/ trop of 4.91; b. 10/2017 Echo: EF 50-55%, no rwma.    Patient Active Problem List   Diagnosis Date Noted  . Ruptured appendicitis 05/15/2018  . Labor and delivery, indication for care 04/03/2018  . Delivery normal 04/03/2018  . 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 Anemia in pregnancy, third trimester 02/11/2018  . Acute idiopathic pericarditis   . Acute chest pain 11/15/2017  . Headache in pregnancy, antepartum, second trimester 10/07/2017  . Marijuana use 10/07/2017  . Encounter for supervision of low-risk pregnancy, antepartum 10/02/2017  . Supervision of normal first teen pregnancy in second trimester 10/02/2017    Past Surgical History:  Procedure Laterality Date  . LAPAROSCOPIC APPENDECTOMY N/A 05/16/2018   Procedure: APPENDECTOMY LAPAROSCOPIC;  Surgeon: Olean Ree, MD;  Location: [facility] ORS;  Service: General;  Laterality: N/A;    Prior to Admission medications   Medication Sig  Start Date End Date Taking? Authorizing Provider  amoxicillin-clavulanate (AUGMENTIN) 875-125 MG tablet Take 1 tablet by mouth 2 (two) times daily for 10 days. 05/19/18 05/29/18  Tylene Fantasia, PA-C  medroxyPROGESTERone (DEPO-PROVERA) 150 MG/ML injection Inject 1 mL (150 mg total) into the muscle every 3 (three) months. 04/23/18   Rod Can, CNM  oxyCODONE (OXY IR/ROXICODONE) 5 MG immediate release tablet Take 1 tablet (5 mg total) by mouth every 6 (six) hours as needed for severe pain. 05/19/18   Tylene Fantasia, PA-C    Allergies  Allergen Reactions  . Tylenol [Acetaminophen] Other (See Comments)    Headache.    No family history on file.  Social History Social History   Tobacco Use  . Smoking status: Never Smoker  . Smokeless tobacco: Never Used  Substance Use Topics  . Alcohol use: No    Frequency: Never  . Drug use: Yes    Types: Marijuana    Review of Systems Constitutional: Negative for fever. Cardiovascular: Negative for chest pain. Respiratory: Negative for shortness of breath. Gastrointestinal: Lower abdominal pain.  Positive for nausea. Genitourinary: Negative for urinary compaints Musculoskeletal: Negative for musculoskeletal complaints Skin: Negative for skin complaints  Neurological: Negative for headache All other ROS negative  ____________________________________________   PHYSICAL EXAM:  VITAL SIGNS: ED Triage Vitals  Enc Vitals Group     BP 05/20/18 1813 125/74     Pulse Rate 05/20/18 1813 (!) 104     Resp 05/20/18 1813 16     Temp 05/20/18 1813 98.6 F (37 C)     Temp Source 05/20/18 1813 Oral     SpO2  05/20/18 1813 98 %     Weight 05/20/18 1814 149 lb 14.6 oz (68 kg)     Height 05/20/18 1814 5\' 4"  (1.626 m)     Head Circumference --      Peak Flow --      Pain Score 05/20/18 1814 6     Pain Loc --      Pain Edu? --      Excl. in [place]? --     Constitutional: Alert and oriented. Well appearing and in no distress. Eyes: Normal  exam ENT   Head: Normocephalic and atraumatic.   Mouth/Throat: Mucous membranes are moist. Cardiovascular: Normal rate, regular rhythm. No murmur Respiratory: Normal respiratory effort without tachypnea nor retractions. Breath sounds are clear  Gastrointestinal: Patient has a soft abdomen she does have moderate tenderness across the entire lower abdomen.  There are 2 JP drains inserted with serous fluid in the JP drain.  There is mild drainage from the umbilical incision but no obvious erythema or induration. Musculoskeletal: Nontender with normal range of motion in all extremities.  Neurologic:  Normal speech and language. No gross focal neurologic deficits Skin:  Skin is warm, dry and intact.  Psychiatric: Mood and affect are normal.  ____________________________________________    RADIOLOGY  CT pending  ____________________________________________   INITIAL IMPRESSION / ASSESSMENT AND PLAN / ED COURSE  Pertinent labs & imaging results that were available during my care of the patient were reviewed by me and considered in my medical decision making (see chart for details).  Patient presents to the emergency department for lower abdominal discomfort after a ruptured appendicitis/appendectomy 05/16/2018.  Differential would include intra-abdominal abscess, postoperative infection, postoperative pain, routine healing.  Patient's labs are reassuringly largely within normal limits with a downtrending white blood cell count.  However given the patient's increased drainage with abdominal pain I discussed the patient with general surgery, they recommend either CT scan versus outpatient follow-up tomorrow.  I discussed these options with the patient she much prefers to have the CT scan performed.  I did discuss risk of radiation with the patient she still wishes to have CT performed which I believe is reasonable.  We will dose pain and nausea medication, IV hydrate and obtain a CT scan of  the abdomen/pelvis to further evaluate.  As long as the CT does not show any acute findings we will discharge with surgery follow-up.  Patient agreeable to plan of care.   CT scan pending, patient care signed out to oncoming physician.  ____________________________________________   FINAL CLINICAL IMPRESSION(S) / ED DIAGNOSES  Lower abdominal pain    Harvest Dark, MD 05/20/18 2247

## 2018-05-20 NOTE — Telephone Encounter (Signed)
Patient called stating that she was having some discomfort and bleeding on her umbilical incision that just started after her waking up from a nap. She stated that it was not gushing out blood but that it was the size of a golf ball that looked blood with white clear fluid. She also stated that her umbilical incision felt warm at touch. Patient denied having redness on the incisions, fever, chills, nausea, vomiting, constipation or diarrhea. Therefore, I do not think that it is infected. I recommended for her to take a shower, pat dry the incisions and apply a dry piece of gauze and that we would see her tomorrow morning to make sure that it was not infected. I also recommended to go to the ED if the started to bleed heavily from the incisions and or developed a fever. Patient understood.

## 2018-05-20 NOTE — Discharge Instructions (Signed)
As we discussed please follow-up with general surgery by calling tomorrow to arrange a follow-up appointment tomorrow for recheck/reevaluation.  Return to the emergency department for any acute increase in pain or development of fever.

## 2018-05-21 ENCOUNTER — Encounter: Payer: Self-pay | Admitting: Surgery

## 2018-05-21 ENCOUNTER — Encounter: Payer: Self-pay | Admitting: Radiology

## 2018-05-21 ENCOUNTER — Emergency Department: Payer: Medicaid Other

## 2018-05-21 MED ORDER — IOPAMIDOL (ISOVUE-300) INJECTION 61%
100.0000 mL | Freq: Once | INTRAVENOUS | Status: AC | PRN
  Administered 2018-05-21: 100 mL via INTRAVENOUS

## 2018-05-26 ENCOUNTER — Ambulatory Visit (INDEPENDENT_AMBULATORY_CARE_PROVIDER_SITE_OTHER): Payer: Self-pay | Admitting: Surgery

## 2018-05-26 ENCOUNTER — Encounter: Payer: Self-pay | Admitting: Surgery

## 2018-05-26 VITALS — BP 106/71 | HR 94 | Temp 97.7°F | Ht 64.0 in | Wt 146.0 lb

## 2018-05-26 DIAGNOSIS — Z4889 Encounter for other specified surgical aftercare: Secondary | ICD-10-CM

## 2018-05-26 MED ORDER — AMOXICILLIN-POT CLAVULANATE 875-125 MG PO TABS: 1.0000 | ORAL_TABLET | Freq: Two times a day (BID) | ORAL | 0 refills | Status: DC

## 2018-05-26 NOTE — Patient Instructions (Addendum)
Please do not remove the bandage on the pelvic area for 48 hours. Then you are able to shower, pat dry and then replace with a dry gauze.  Please give [place] a call in case you have any questions or concerns.    GENERAL POST-OPERATIVE PATIENT INSTRUCTIONS   WOUND CARE INSTRUCTIONS:  Keep a dry clean dressing on the wound if there is drainage. The initial bandage may be removed after 24 hours.  Once the wound has quit draining you may leave it open to air.  If clothing rubs against the wound or causes irritation and the wound is not draining you may cover it with a dry dressing during the daytime.  Try to keep the wound dry and avoid ointments on the wound unless directed to do so.  If the wound becomes bright red and painful or starts to drain infected material that is not clear, please contact your physician immediately.  If the wound is mildly pink and has a thick firm ridge underneath it, this is normal, and is referred to as a healing ridge.  This will resolve over the next 4-6 weeks.  BATHING: You may shower if you have been informed of this by your surgeon. However, Please do not submerge in a tub, hot tub, or pool until incisions are completely sealed or have been told by your surgeon that you may do so.  DIET:  You may eat any foods that you can tolerate.  It is a good idea to eat a high fiber diet and take in plenty of fluids to prevent constipation.  If you do become constipated you may want to take a mild laxative or take ducolax tablets on a daily basis until your bowel habits are regular.  Constipation can be very uncomfortable, along with straining, after recent surgery.  ACTIVITY:  You are encouraged to cough and deep breath or use your incentive spirometer if you were given one, every 15-30 minutes when awake.  This will help prevent respiratory complications and low grade fevers post-operatively if you had a general anesthetic.  You may want to hug a pillow when coughing and sneezing to  add additional support to the surgical area, if you had abdominal or chest surgery, which will decrease pain during these times.  You are encouraged to walk and engage in light activity for the next two weeks.  You should not lift more than 20 pounds, until 06/13/2018 as it could put you at increased risk for complications.  Twenty pounds is roughly equivalent to a plastic bag of groceries. At that time- Listen to your body when lifting, if you have pain when lifting, stop and then try again in a few days. Soreness after doing exercises or activities of daily living is normal as you get back in to your normal routine.  MEDICATIONS:  Try to take narcotic medications and anti-inflammatory medications, such as tylenol, ibuprofen, naprosyn, etc., with food.  This will minimize stomach upset from the medication.  Should you develop nausea and vomiting from the pain medication, or develop a rash, please discontinue the medication and contact your physician.  You should not drive, make important decisions, or operate machinery when taking narcotic pain medication.  SUNBLOCK Use sun block to incision area over the next year if this area will be exposed to sun. This helps decrease scarring and will allow you avoid a permanent darkened area over your incision.  QUESTIONS:  Please feel free to call our office if you have any  questions, and we will be glad to assist you. [phone]

## 2018-05-26 NOTE — Progress Notes (Signed)
Surgical Clinic Progress/Follow-up Note   HPI:  17 y.o. Female presents to clinic for post-op follow-up 10 Days s/p laparoscopic appendectomy with drainage of peri-appendiceal abscess (Piscoya, 05/16/2018). Patient reports her pain has much improved since she returned to [facility] ED for worsening abdominal pain and drainage from infra-umbilical laparoscopic port site 2 days after discharge from [facility]. She has been tolerating regular diet despite limited appetite with +flatus and normal BM's, denies N/V, fever/chills, CP, or SOB. Patient also states she has not emptied either of her drains over the past 2 days with 20 mL thin pink fluid in her midline lower abdominal drain and 25 mL tan milky fluid in her LLQ drain (to RLQ per op report).  Review of Systems:  Constitutional: denies fever/chills  Respiratory: denies shortness of breath, wheezing  Cardiovascular: denies chest pain, palpitations  Gastrointestinal: abdominal pain, N/V, and bowel function as per interval history Skin: Denies any other rashes or skin discolorations except post-surgical wounds as per interval history  Vital Signs:  BP 106/71   Pulse 94   Temp 97.7 F (36.5 C) (Skin)   Ht 5\' 4"  (1.626 m)   Wt 146 lb (66.2 kg)   LMP 05/06/2018 (Approximate)   BMI 25.06 kg/m    Physical Exam:  Constitutional:  -- Normal recently post-partum body habitus  -- Awake, alert, and oriented x3  Pulmonary:  -- No crackles -- Equal breath sounds bilaterally -- Breathing non-labored at rest Cardiovascular:  -- S1, S2 present  -- No pericardial rubs  Gastrointestinal:  -- Soft and non-distended, minimally to non-tender to palpation, no guarding/rebound tenderness -- Post-surgical incisions all well-approximated without any peri-incisional erythema or drainage except infra-umbilical staples seem to have pulled through upper aspect (towards umbilicus) post-surgical wound, leaving a small amount (1 mm) separation between upper and lower  edges of wound -- After not emptying x 2 days per patient: 20 mL serosanguinous in midline lower abdominal drain (to pelvis per op report), 25 mL tan milky non-malodorous fluid in LLQ drain (to RLQ per op report) -- No abdominal masses appreciated, pulsatile or otherwise  Musculoskeletal / Integumentary:  -- Wounds or skin discoloration: None appreciated except post-surgical incisions as described above (GI) -- Extremities: B/L UE and LE FROM, hands and feet warm, no edema   Laboratory studies:  CBC Latest Ref Rng & Units 05/20/2018 05/19/2018 05/18/2018  WBC 3.6 - 11.0 K/uL 10.8 9.9 14.9(H)  Hemoglobin 12.0 - 16.0 g/dL 10.8(L) 10.2(L) 9.6(L)  Hematocrit 35.0 - 47.0 % 31.0(L) 29.2(L) 28.5(L)  Platelets 150 - 440 K/uL 356 291 198   CMP Latest Ref Rng & Units 05/20/2018 05/18/2018 05/17/2018  Glucose 70 - 99 mg/dL 78 149(H) 147(H)  BUN 6 - 20 mg/dL 6 9 10   Creatinine 0.44 - 1.00 mg/dL 0.53 0.52 0.56  Sodium 135 - 145 mmol/L 139 137 138  Potassium 3.5 - 5.1 mmol/L 3.4(L) 3.7 4.3  Chloride 98 - 111 mmol/L 105 112(H) 111  CO2 22 - 32 mmol/L 24 22 22   Calcium 8.9 - 10.3 mg/dL 8.5(L) 8.0(L) 8.0(L)  Total Protein 6.5 - 8.1 g/dL 6.6 - -  Total Bilirubin 0.3 - 1.2 mg/dL 1.3(H) - -  Alkaline Phos 38 - 126 U/L 75 - -  AST 15 - 41 U/L 15 - -  ALT 0 - 44 U/L 15 - -   Imaging:  CT Abdomen and Pelvis with Contrast (05/21/2018) - personally reviewed and discussed with patient 1. Diffuse inflammatory changes and stranding of the pelvis and  small amount of abdominopelvic fluid. No organized or drainable fluid collection/abscess. 2. Interval development of bilateral pleural effusions with associated partial compressive atelectasis of the lower lobes.  Assessment:  17 y.o. yo Female with a problem list including...  Patient Active Problem List   Diagnosis Date Noted  . Ruptured appendicitis 05/15/2018  . Labor and delivery, indication for care 04/03/2018  . Delivery normal 04/03/2018  . Depression  affecting pregnancy in third trimester, antepartum 02/11/2018  . Anemia in pregnancy, third trimester 02/11/2018  . Acute idiopathic pericarditis   . Acute chest pain 11/15/2017  . Headache in pregnancy, antepartum, second trimester 10/07/2017  . Marijuana use 10/07/2017  . Encounter for supervision of low-risk pregnancy, antepartum 10/02/2017  . Supervision of normal first teen pregnancy in second trimester 10/02/2017    presents to clinic for post-op follow-up evaluation, doing overall well despite slowed persistent purulent drainage from LLQ drain (to peri-appendicea RLQ per operative report) 10 Days s/p laparoscopic appendectomy with drainage of peri-appendiceal abscess (Piscoya, 05/16/2018) for perforated appendicitis with abscess and developing pre-operative sepsis.  Plan:              - advance diet as tolerated  - will extend prescription for Augmentin x 7 days for RLQ abscess             - okay to shower, but do not submerge incisions under water (baths, swimming) until wounds healed             - continue no heavy lifting or strenuous activities over next 5 days, after which may gradually resume activities with no heavy lifting >40 lbs and without restrictions after next 2 weeks  - continue drain care and to empty and maintain record of              - return to clinic next week, instructed to call office if any questions or concerns  All of the above recommendations were discussed with the patient family, and all of patient's questions were answered to her expressed satisfaction.  -- Marilynne Drivers Rosana Hoes, MD, [place]: [facility] - Partnering for exceptional care. Office: [phone]

## 2018-05-28 ENCOUNTER — Telehealth: Payer: Self-pay | Admitting: *Deleted

## 2018-05-28 ENCOUNTER — Encounter: Payer: Medicaid Other | Admitting: Surgery

## 2018-05-28 NOTE — Telephone Encounter (Signed)
Patient states her incision has opened. She states no redness, denies fever, no pus. The drainage is light yellowish color.  Patient has an appointment on Tuesday of next week to have drain removed and we discussed the signs and symptom's of infection and to call office if any of the above occur. We discussed washing with soap and water and to pat dry and apply dressing but do not submerge in water, hot tub.

## 2018-05-28 NOTE — Telephone Encounter (Signed)
Patient had surgery on 05/16/18 and came for a follow up on 05/26/18, patient stated that the belly button area came open and she was wondering if she can get it wet.

## 2018-05-29 ENCOUNTER — Telehealth: Payer: Self-pay | Admitting: Licensed Clinical Social Worker

## 2018-05-29 NOTE — Telephone Encounter (Signed)
CSW followed up with patient regarding an EMMI call. Patient denies that she was feeling sad or hopeless and states it was an error. Patient states she has been doing well with the exception of her wound however, she states she called her physician office yesterday and received direction on what she needed to do.  Shela Leff MSW,LCSW [phone]

## 2018-06-02 ENCOUNTER — Encounter: Payer: Self-pay | Admitting: Surgery

## 2018-06-02 ENCOUNTER — Ambulatory Visit (INDEPENDENT_AMBULATORY_CARE_PROVIDER_SITE_OTHER): Payer: Self-pay | Admitting: Surgery

## 2018-06-02 VITALS — BP 109/73 | HR 80 | Temp 97.5°F | Ht 64.0 in | Wt 143.0 lb

## 2018-06-02 DIAGNOSIS — K3532 Acute appendicitis with perforation and localized peritonitis, without abscess: Secondary | ICD-10-CM

## 2018-06-02 DIAGNOSIS — Z4889 Encounter for other specified surgical aftercare: Secondary | ICD-10-CM

## 2018-06-02 NOTE — Patient Instructions (Signed)
Please call our office if you have questions or concerns.   

## 2018-06-02 NOTE — Progress Notes (Signed)
Surgical Clinic Progress/Follow-up Note   HPI:  17 y.o. Female presents to clinic for subsequent post-op follow-up 18 Days s/p laparoscopic appendectomy with drainage of peri-appendiceal abscess (Piscoya, 05/16/2018). Patient reports complete resolution of pre-operative pain and has been tolerating regular diet with +flatus and normal BM's, denies N/V, fever/chills, CP, or SOB. She adds that she's only emptied thin tan non-purulent fluid out of her bulb suction x2 over the past week. Her new baby has also been doing well.  Review of Systems:  Constitutional: denies fever/chills  Respiratory: denies shortness of breath, wheezing  Cardiovascular: denies chest pain, palpitations  Gastrointestinal: abdominal pain, N/V, and bowel function as per interval history Skin: Denies any other rashes or skin discolorations except post-surgical wounds as per interval history  Vital Signs:  BP 109/73   Pulse 80   Temp (!) 97.5 F (36.4 C) (Skin)   Ht 5\' 4"  (1.626 m)   Wt 143 lb (64.9 kg)   LMP 05/06/2018 (Approximate)   BMI 24.55 kg/m    Physical Exam:  Constitutional:  -- Normal body habitus  -- Awake, alert, and oriented x3  Pulmonary:  -- No crackles -- Equal breath sounds bilaterally -- Breathing non-labored at rest Cardiovascular:  -- S1, S2 present  -- No pericardial rubs  Gastrointestinal:  -- Soft and non-distended, non-tender to palpation, no guarding/rebound tenderness -- Post-surgical incisions all well-approximated without any peri-incisional erythema or drainage, post-surgical drain well-secured with 10 mL thin tan fluid -- No abdominal masses appreciated, pulsatile or otherwise  Musculoskeletal / Integumentary:  -- Wounds or skin discoloration: None appreciated except post-surgical incisions as described above (GI) -- Extremities: B/L UE and LE FROM, hands and feet warm, no edema   Assessment:  17 y.o. yo Female with a problem list including...  Patient Active Problem  List   Diagnosis Date Noted  . Ruptured appendicitis 05/15/2018  . Labor and delivery, indication for care 04/03/2018  . Delivery normal 04/03/2018  . 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 Anemia in pregnancy, third trimester 02/11/2018  . Acute idiopathic pericarditis   . Acute chest pain 11/15/2017  . Headache in pregnancy, antepartum, second trimester 10/07/2017  . Marijuana use 10/07/2017  . Encounter for supervision of low-risk pregnancy, antepartum 10/02/2017  . Supervision of normal first teen pregnancy in second trimester 10/02/2017    presents to clinic for post-op follow-up evaluation, doing overall well 20 Days s/p laparoscopic appendectomy with drainage of peritoneal abscess (Piscoya, 05/16/2018).  Plan:              - advance diet as tolerated  - complete course of prescribed antibiotics as instructed  - drain removed along with single remaining suprapubic staple             - keep drain site clean and dry x 2 days, after which okay to submerge incisions under water (baths, swimming) prn             - gradually resume all activities without restrictions over next 2 weeks             - apply sunblock particularly to incisions with sun exposure to reduce pigmentation of scars  - offered and advised routine follow-up, but patient expresses she'd rather call as needed             - return to clinic as needed, instructed to call office if any questions or concerns  All of the above recommendations were discussed with the patient,  and all of patient's and family's questions were answered to their expressed satisfaction.  -- Marilynne Drivers Rosana Hoes, MD, [place]: [facility] - Partnering for exceptional care. Office: [phone]

## 2018-07-21 ENCOUNTER — Ambulatory Visit: Payer: Medicaid Other

## 2018-08-05 ENCOUNTER — Ambulatory Visit: Payer: Medicaid Other

## 2019-05-10 ENCOUNTER — Ambulatory Visit: Payer: Self-pay

## 2019-05-25 ENCOUNTER — Ambulatory Visit (LOCAL_COMMUNITY_HEALTH_CENTER): Payer: Self-pay

## 2019-05-25 ENCOUNTER — Other Ambulatory Visit: Payer: Self-pay

## 2019-05-25 VITALS — BP 102/71 | Ht 64.0 in | Wt 155.5 lb

## 2019-05-25 DIAGNOSIS — Z30013 Encounter for initial prescription of injectable contraceptive: Secondary | ICD-10-CM

## 2019-05-25 DIAGNOSIS — Z3009 Encounter for other general counseling and advice on contraception: Secondary | ICD-10-CM

## 2019-05-25 MED ORDER — MEDROXYPROGESTERONE ACETATE 150 MG/ML IM SUSP
150.0000 mg | Freq: Once | INTRAMUSCULAR | Status: AC
  Administered 2019-05-25: 150 mg via INTRAMUSCULAR

## 2019-05-25 NOTE — Progress Notes (Signed)
Last physical at [facility] 08/17/2018. Last depo at [facility] 02/08/2019; 15.1 weeks post depo. DMPA 150 mg IM today per Antoine Primas, PA order dated 08/17/2018.

## 2020-08-09 ENCOUNTER — Other Ambulatory Visit: Payer: Self-pay

## 2020-08-09 ENCOUNTER — Ambulatory Visit (LOCAL_COMMUNITY_HEALTH_CENTER): Payer: Self-pay

## 2020-08-09 VITALS — BP 99/66 | Ht 64.0 in | Wt 161.5 lb

## 2020-08-09 DIAGNOSIS — Z3201 Encounter for pregnancy test, result positive: Secondary | ICD-10-CM

## 2020-08-09 LAB — PREGNANCY, URINE: Preg Test, Ur: POSITIVE — AB

## 2020-08-09 MED ORDER — PRENATAL VITAMIN 27-0.8 MG PO TABS: 1.0000 | ORAL_TABLET | Freq: Every day | ORAL | 0 refills | Status: AC

## 2020-08-09 NOTE — Progress Notes (Signed)
Pt desires prenatal care at [facility]; sent to preadmit.

## 2020-08-11 ENCOUNTER — Encounter: Payer: Medicaid Other | Admitting: Advanced Practice Midwife

## 2020-08-26 NOTE — L&D Delivery Note (Signed)
Delivery Note Called to Labor and Delivery as patient had just arrived and was found to be 9+ cms dilated with a desire to push. At  1028, a viable female was delivered vaginally over an intact perineum (Presentation: ROA with restitution to ROT     ). No nuchal cord noted once head delivered. Slight "Turtle sign " noted once fetal head delivered.   Delivery of the head: 1027  ,   Additional pushing effort requested of the mother (patient); Second maneuver: ,  Gentle downward guidance to deliver the anterior shoulder; Third maneuver: ,  HOB laid flat, McRoberts maneuver. With additional maternal effort, the posterior shoulder was delivered using upward guidance    Verbal consent: obtained from patient.  APGAR:9 9, ; weight  .pending   Placenta status: , delivered at 1135., intact  Cord:  3 vessel with the following complications: none.   Anesthesia:  none Episiotomy:  none Lacerations:  none Suture Repair:  na Est. Blood Loss (mL):  400 cc  Mom to postpartum.  Baby to Couplet care / Skin to Skin.  [name] 03/30/2021, 11:38 AM  [name] 03/30/2021, 11:37 AM

## 2020-09-04 ENCOUNTER — Ambulatory Visit: Payer: Medicaid Other | Admitting: Advanced Practice Midwife

## 2020-09-04 ENCOUNTER — Other Ambulatory Visit: Payer: Self-pay

## 2020-09-04 DIAGNOSIS — Z3481 Encounter for supervision of other normal pregnancy, first trimester: Secondary | ICD-10-CM

## 2020-09-04 DIAGNOSIS — Z3483 Encounter for supervision of other normal pregnancy, third trimester: Secondary | ICD-10-CM | POA: Insufficient documentation

## 2020-09-04 DIAGNOSIS — F5089 Other specified eating disorder: Secondary | ICD-10-CM | POA: Insufficient documentation

## 2020-09-04 DIAGNOSIS — F32A Depression, unspecified: Secondary | ICD-10-CM

## 2020-09-04 DIAGNOSIS — T7421XA Adult sexual abuse, confirmed, initial encounter: Secondary | ICD-10-CM | POA: Insufficient documentation

## 2020-09-04 DIAGNOSIS — O99341 Other mental disorders complicating pregnancy, first trimester: Secondary | ICD-10-CM

## 2020-09-04 DIAGNOSIS — T7421XS Adult sexual abuse, confirmed, sequela: Secondary | ICD-10-CM

## 2020-09-04 LAB — URINALYSIS
Bilirubin, UA: NEGATIVE
Glucose, UA: NEGATIVE
Ketones, UA: NEGATIVE
Leukocytes,UA: NEGATIVE
Nitrite, UA: NEGATIVE
Protein,UA: NEGATIVE
Specific Gravity, UA: 1.025 (ref 1.005–1.030)
Urobilinogen, Ur: 0.2 mg/dL (ref 0.2–1.0)
pH, UA: 7 (ref 5.0–7.5)

## 2020-09-04 LAB — WET PREP FOR TRICH, YEAST, CLUE
Trichomonas Exam: NEGATIVE
Yeast Exam: NEGATIVE

## 2020-09-04 LAB — HEMOGLOBIN, FINGERSTICK: Hemoglobin: 13.2 g/dL (ref 11.1–15.9)

## 2020-09-04 NOTE — Progress Notes (Signed)
[place] [id number] [phone]  INITIAL PRENATAL VISIT NOTE  Subjective:  Brittany Vaughn is a 20 y.o. SWF ex smoker G2P1001 at [redacted]w[redacted]d being seen today to start prenatal care at the [facility].She feels "I'm happy about it--I hope it's a boy" about surprise pregnancy with no birth control since 07/2019.  22 yo employed FOB feels "excited" about pregnancy--he has a 33 yo daughter who lives out of state and is not father of her 72 yo daughter; is supportive and in 1.5 year relationship.  She is not in school and unemployed and living with FOB, 22 yo female roommate, and she has split custody of her 29 yo child.  Denies ER use or u/s this pregnancy.  LMP 06/12/20.  Last cig end November, last vape end November, last MJ end November, last ETOH 04/2020 (1 wine cooler).   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  Patient reports no complaints.  Contractions: Not present. Vag. Bleeding: None.  Movement: Absent. Denies leaking of fluid.   Indications for ASA therapy (per uptodate) One of the following: Previous pregnancy with preeclampsia, especially early onset and with an adverse outcome No Multifetal gestation No Chronic hypertension No Type 1 or 2 diabetes mellitus No Chronic kidney disease No Autoimmune disease (antiphospholipid syndrome, systemic lupus erythematosus) No  Two or more of the following: Nulliparity No Obesity (body mass index >30 kg/m2) No Family history of preeclampsia in mother or sister No Age ?35 years No Sociodemographic  characteristics (African American race, low socioeconomic level) No Personal risk factors (eg, previous pregnancy with low birth weight or small for gestational age infant, previous adverse pregnancy outcome [eg, stillbirth], interval >10 years between pregnancies) No   The following portions of the patient's history were reviewed and updated as appropriate: allergies, current medications, past family history, past medical history, past social history, past surgical history and problem list. Problem list updated.  Objective:   Vitals:   09/04/20 1312  BP: 106/69  Pulse: 85  Temp: 97.6 F (36.4 C)  Weight: 163 lb (73.9 kg)    Fetal Status: Fetal Heart Rate (bpm): 160 Fundal Height: 12 cm Movement: Absent  Presentation: Undeterminable   Physical Exam Vitals and nursing note reviewed.  Constitutional:      General: She is not in acute distress.    Appearance: Normal appearance. She is well-developed.  HENT:     Head: Normocephalic and atraumatic.     Right Ear: External ear normal.     Left Ear: External ear normal.     Nose: Nose normal. No congestion or rhinorrhea.     Mouth/Throat:     Lips: Pink.     Mouth: Mucous membranes are moist.     Dentition: Normal dentition. No dental caries.     Pharynx: Oropharynx is clear. Uvula midline.  Eyes:     General: No scleral icterus.    Conjunctiva/sclera: Conjunctivae normal.  Neck:     Thyroid: No thyroid mass or thyromegaly.  Cardiovascular:     Rate and Rhythm: Normal rate.     Pulses: Normal pulses.     Comments: Extremities are warm and well perfused Pulmonary:  Effort: Pulmonary effort is normal.     Breath sounds: Normal breath sounds.  Chest:     Chest wall: No mass.  Breasts:     Tanner Score is 5. Breasts are symmetrical.     Right: Normal. No mass, nipple discharge, skin change or axillary adenopathy.     Left: Normal. No mass, nipple discharge, skin change or axillary adenopathy.    Abdominal:      Palpations: Abdomen is soft.     Tenderness: There is no abdominal tenderness.     Comments: Gravid, soft without tenderness, FHR=160  Genitourinary:    General: Normal vulva.     Exam position: Lithotomy position.     Pubic Area: No rash.      Labia:        Right: No rash.        Left: No rash.      Vagina: Vaginal discharge (white creamy leukorrhea, ph<4.5) present.     Cervix: Normal.     Uterus: Normal. Enlarged (Gravid 10-12 wks size). Not tender.      Adnexa: Right adnexa normal and left adnexa normal.     Rectum: Normal. No external hemorrhoid.  Musculoskeletal:     Right lower leg: No edema.     Left lower leg: No edema.  Lymphadenopathy:     Upper Body:     Right upper body: No axillary adenopathy.     Left upper body: No axillary adenopathy.  Skin:    General: Skin is warm.     Capillary Refill: Capillary refill takes less than 2 seconds.  Neurological:     Mental Status: She is alert.     Assessment and Plan:  Pregnancy: G2P1001 at [redacted]w[redacted]d  1. Supervision of normal intrauterine pregnancy in multigravida, first trimester Desires FIRST screen--referral written Please offer flu and Covid vaccine - Urine Culture - Chlamydia/GC NAA, Confirmation - HIV-1/HIV-2 Qualitative RNA - HCV Ab w Reflex to Quant PCR - Prenatal profile without Varicella/Rubella (694503) - [id number] Drug Screen - Urinalysis (Urine Dip) - Hemoglobin, venipuncture - WET PREP FOR TRICH, YEAST, CLUE    Discussed overview of care and coordination with inpatient delivery practices including [facility], [facility], [facility] and [facility].   Reviewed Centering pregnancy as standard of care at [facility], oriented to room and showed video. Based on EDD, plan for Cycle    Preterm labor symptoms and general obstetric precautions including but not limited to vaginal bleeding, contractions, leaking of fluid and fetal movement were reviewed in detail with the patient.  Please refer to After Visit Summary  for other counseling recommendations.   Return in about 4 weeks (around 10/02/2020) for routine PNC.  No future appointments.  Herbie Saxon, CNM

## 2020-09-04 NOTE — Progress Notes (Signed)
Presents for initiation of prenatal care and is taking prenatal vitamin daily. Counseled to avoid headache relievers that contain aspirin (allergic to Tylenol) and to discuss pain relief with provider at today's visit. Rich Number, RN  In house labs reviewed and no interventions required per standing order. [facility] first screen referral faxed with snapshot pages and fax confirmation received. Client aware either MFM or [facility] RN will notify her of appt. Rich Number, RN

## 2020-09-05 ENCOUNTER — Other Ambulatory Visit: Payer: Self-pay | Admitting: Advanced Practice Midwife

## 2020-09-05 ENCOUNTER — Telehealth: Payer: Self-pay

## 2020-09-05 DIAGNOSIS — Z369 Encounter for antenatal screening, unspecified: Secondary | ICD-10-CM

## 2020-09-05 LAB — HCV AB W REFLEX TO QUANT PCR: HCV Ab: <0.1 s/co ratio (ref 0.0–0.9)

## 2020-09-05 LAB — CBC/D/PLT+RPR+RH+ABO+AB SCR
Antibody Screen: NEGATIVE
Basophils Absolute: 0 10*3/uL (ref 0.0–0.2)
Basos: 0 %
EOS (ABSOLUTE): 0 10*3/uL (ref 0.0–0.4)
Eos: 0 %
Hematocrit: 40.2 % (ref 34.0–46.6)
Hemoglobin: 13.5 g/dL (ref 11.1–15.9)
Hepatitis B Surface Ag: NEGATIVE
Immature Grans (Abs): 0.1 10*3/uL (ref 0.0–0.1)
Immature Granulocytes: 1 %
Lymphocytes Absolute: 2 10*3/uL (ref 0.7–3.1)
Lymphs: 21 %
MCH: 29.3 pg (ref 26.6–33.0)
MCHC: 33.6 g/dL (ref 31.5–35.7)
MCV: 87 fL (ref 79–97)
Monocytes Absolute: 0.7 10*3/uL (ref 0.1–0.9)
Monocytes: 7 %
Neutrophils Absolute: 6.8 10*3/uL (ref 1.4–7.0)
Neutrophils: 71 %
Platelets: 260 10*3/uL (ref 150–450)
RBC: 4.6 x10E6/uL (ref 3.77–5.28)
RDW: 12 % (ref 11.7–15.4)
RPR Ser Ql: NONREACTIVE
Rh Factor: POSITIVE
WBC: 9.6 10*3/uL (ref 3.4–10.8)

## 2020-09-05 LAB — HCV INTERPRETATION

## 2020-09-05 NOTE — Telephone Encounter (Signed)
Call to client with [facility] first screen appt on 09/12/2020 at 12 noon (genetic counseling) and 1 pm ([place]). Client provided verbal directions to testing facility. Rich Number, RN

## 2020-09-06 ENCOUNTER — Encounter: Payer: Self-pay | Admitting: Advanced Practice Midwife

## 2020-09-06 DIAGNOSIS — R87619 Unspecified abnormal cytological findings in specimens from cervix uteri: Secondary | ICD-10-CM

## 2020-09-06 DIAGNOSIS — R87611 Atypical squamous cells cannot exclude high grade squamous intraepithelial lesion on cytologic smear of cervix (ASC-H): Secondary | ICD-10-CM | POA: Insufficient documentation

## 2020-09-06 HISTORY — DX: Unspecified abnormal cytological findings in specimens from cervix uteri: R87.619

## 2020-09-06 LAB — PAP IG (IMAGE GUIDED): PAP Smear Comment: 0

## 2020-09-06 LAB — 789231 7+OXYCODONE-BUND
Amphetamines, Urine: NEGATIVE ng/mL
BENZODIAZ UR QL: NEGATIVE ng/mL
Barbiturate screen, urine: NEGATIVE ng/mL
Cannabinoid Quant, Ur: NEGATIVE ng/mL
Cocaine (Metab.): NEGATIVE ng/mL
OPIATE SCREEN URINE: NEGATIVE ng/mL
Oxycodone/Oxymorphone, Urine: NEGATIVE ng/mL
PCP Quant, Ur: NEGATIVE ng/mL

## 2020-09-06 LAB — URINE CULTURE: Organism ID, Bacteria: NO GROWTH

## 2020-09-06 LAB — CHLAMYDIA/GC NAA, CONFIRMATION
Chlamydia trachomatis, NAA: NEGATIVE
Neisseria gonorrhoeae, NAA: NEGATIVE

## 2020-09-07 ENCOUNTER — Encounter: Payer: Self-pay | Admitting: Advanced Practice Midwife

## 2020-09-08 LAB — HIV-1/HIV-2 QUALITATIVE RNA
HIV-1 RNA, Qualitative: NONREACTIVE
HIV-2 RNA, Qualitative: NONREACTIVE

## 2020-09-12 ENCOUNTER — Ambulatory Visit: Payer: Medicaid Other

## 2020-09-12 ENCOUNTER — Other Ambulatory Visit: Payer: Self-pay

## 2020-09-14 ENCOUNTER — Ambulatory Visit (HOSPITAL_BASED_OUTPATIENT_CLINIC_OR_DEPARTMENT_OTHER): Payer: Medicaid Other

## 2020-09-14 ENCOUNTER — Other Ambulatory Visit
Admission: RE | Admit: 2020-09-14 | Discharge: 2020-09-14 | Disposition: A | Discharge status: Home / Self Care | Payer: Medicaid Other | Source: Ambulatory Visit | Attending: Maternal & Fetal Medicine | Admitting: Maternal & Fetal Medicine

## 2020-09-14 ENCOUNTER — Other Ambulatory Visit: Payer: Self-pay

## 2020-09-14 DIAGNOSIS — Z369 Encounter for antenatal screening, unspecified: Secondary | ICD-10-CM

## 2020-09-14 DIAGNOSIS — Z36 Encounter for antenatal screening for chromosomal anomalies: Secondary | ICD-10-CM | POA: Diagnosis present

## 2020-09-14 DIAGNOSIS — Z3A12 12 weeks gestation of pregnancy: Secondary | ICD-10-CM

## 2020-09-14 NOTE — Progress Notes (Signed)
Department, [place]* Length of Consultation: 25 minutes   Brittany Vaughn  was referred to [facility] at [facility] for genetic counseling to review prenatal screening and testing options.  This note summarizes the information we discussed.    We offered the following routine screening tests for this pregnancy:  Cell free fetal DNA testing from maternal blood may be used to determine whether a baby is at high risk for Down syndrome, trisomy 12, or trisomy 59.  This test utilizes a maternal blood sample and DNA sequencing technology to isolate circulating cell free fetal DNA from maternal plasma.  The fetal DNA can then be analyzed for DNA sequences that are derived from the three most common chromosomes involved in aneuploidy, chromosomes 13, 18, and 21.  If the overall amount of DNA is greater than the expected level for any of these chromosomes, aneuploidy is suspected.  The detection rate for Down syndrome and trisomy 18 is >99% and the detection rate for trisomy 13 is >91%. While we do not consider it a replacement for invasive testing and karyotype analysis, a negative result from this testing would be reassuring, though not a guarantee of a normal chromosome complement for the baby.  An abnormal result is certainly suggestive of an abnormal chromosome complement, though we would still recommend CVS or amniocentesis to confirm any findings from this testing. This testing can also assess for the sex chromosomes and can detect approximately 96% of sex chromosome aneuploidies and determine fetal gender with >99% confidence.    First trimester screening, which includes nuchal translucency ultrasound screen and first trimester maternal serum marker screening.  The nuchal translucency has approximately an 80% detection rate for Down syndrome and can be positive for other chromosome abnormalities as well as congenital heart defects.  When combined with a maternal serum marker screening, the  detection rate is up to 90% for Down syndrome and up to 97% for trisomy 18.     Maternal serum marker screening, a blood test that measures pregnancy proteins, can provide risk assessments for Down syndrome, trisomy 18, and open neural tube defects (spina bifida, anencephaly). Because it does not directly examine the fetus, it cannot positively diagnose or rule out these problems.  Targeted ultrasound uses high frequency sound waves to create an image of the developing fetus.  An ultrasound is often recommended as a routine means of evaluating the pregnancy.  It is also used to screen for fetal anatomy problems (for example, a heart defect) that might be suggestive of a chromosomal or other abnormality.   Should these screening tests indicate an increased concern, then the following additional testing options would be offered:  The chorionic villus sampling procedure is available for first trimester chromosome analysis.  This involves the withdrawal of a small amount of chorionic villi (tissue from the developing placenta).  Risk of pregnancy loss is estimated to be approximately 1 in 200 to 1 in 100 (0.5 to 1%).  There is approximately a 1% (1 in 100) chance that the CVS chromosome results will be unclear.  Chorionic villi cannot be tested for neural tube defects.     Amniocentesis involves the removal of a small amount of amniotic fluid from the sac surrounding the fetus with the use of a thin needle inserted through the maternal abdomen and uterus.  Ultrasound guidance is used throughout the procedure.  Fetal cells from amniotic fluid are directly evaluated and > 99.5% of chromosome problems and > 98% of open neural  tube defects can be detected. This procedure is generally performed after the 15th week of pregnancy.  The main risks to this procedure include complications leading to miscarriage in less than 1 in 200 cases (0.5%).  Cystic Fibrosis and Spinal Muscular Atrophy (SMA) screening were also  discussed with the patient. Both conditions are recessive, which means that both parents must be carriers in order to have a child with the disease.  Cystic fibrosis (CF) is one of the most common genetic conditions in persons of Caucasian ancestry.  This condition occurs in approximately 1 in 2,500 Caucasian persons and results in thickened secretions in the lungs, digestive, and reproductive systems.  For a baby to be at risk for having CF, both of the parents must be carriers for this condition.  Approximately 1 in 79 Caucasian persons is a carrier for CF.  Current carrier testing looks for the most common mutations in the gene for CF and can detect approximately 90% of carriers in the Caucasian population.  This means that the carrier screening can greatly reduce, but cannot eliminate, the chance for an individual to have a child with CF.  If an individual is found to be a carrier for CF, then carrier testing would be available for the partner. As part of [place] newborn screening profile, all babies born in the state of [place] will have a two-tier screening process.  Specimens are first tested to determine the concentration of immunoreactive trypsinogen (IRT).  The top 5% of specimens with the highest IRT values then undergo DNA testing using a panel of over 40 common CF mutations. SMA is a neurodegenerative disorder that leads to atrophy of skeletal muscle and overall weakness.  This condition is also more prevalent in the Caucasian population, with 1 in 40-1 in 60 persons being a carrier and 1 in 6,000-1 in 10,000 children being affected.  There are multiple forms of the disease, with some causing death in infancy to other forms with survival into adulthood.  The genetics of SMA is complex, but carrier screening can detect up to 95% of carriers in the Caucasian population.  Similar to CF, a negative result can greatly reduce, but cannot eliminate, the chance to have a child with SMA.  Hemoglobinopathy screening was also made available.  The patient is of Caucasian background and the father of the baby is Caucasian and [place].  We obtained a detailed family history and pregnancy history.  This is the second pregnancy for this patient, the first with her current partner.  She has a healthy 30 year old daughter who has asthma. Her partner reported that his mother had a baby who passed away late in pregnancy related to stressful life events.  To his knowledge, there were no birth defects or medical conditions in the baby, though little is known about this history.  If more is learned, we are happy to discuss this further.  His mother is reported to have had a brain tumor on her brainstem that has been there for 12 years.  He has no details about the type of tumor or if it benign or malignant.  Again, if more is learned, we are happy to speak in more detail.  Without knowledge of the type of tumor it is not possible to provide a risk assessment for recurrence.  To his knowledge, there are no other family members with cancer.  The remainder of the family history was reported to be unremarkable for birth defects, intellectual delays,  recurrent pregnancy loss or known chromosome abnormalities.  Brittany Vaughn denies any complications in this pregnancy or exposures to medications, alcohol, recreational drugs or tobacco.  After consideration of the options, Brittany Vaughn elected to proceed with cell free fetal DNA testing.  She declined carrier screening for CF, SMA and hemoglobinopathies.  An ultrasound was performed at the time of the visit.  The gestational age was consistent with 4 weeks.  Fetal anatomy could not be assessed due to early gestational age.  Please refer to the ultrasound report for details of that study. The patient was scheduled to return to our clinic at approximately 19 weeks for an anatomy ultrasound.  Brittany Vaughn was encouraged to call with questions or concerns.  We can be  contacted at [phone].  Labs ordered: MaterniT21 PLUS with [facility]  Wilburt Finlay, MS, CGC

## 2020-09-15 ENCOUNTER — Encounter: Payer: Self-pay | Admitting: Family Medicine

## 2020-09-18 ENCOUNTER — Encounter: Payer: Self-pay | Admitting: Advanced Practice Midwife

## 2020-09-18 DIAGNOSIS — Z3481 Encounter for supervision of other normal pregnancy, first trimester: Secondary | ICD-10-CM

## 2020-09-19 ENCOUNTER — Encounter: Payer: Self-pay | Admitting: Advanced Practice Midwife

## 2020-09-19 LAB — MATERNIT21 PLUS CORE+SCA
Fetal Fraction: 8
Monosomy X (Turner Syndrome): NOT DETECTED
Result (T21): NEGATIVE
Trisomy 13 (Patau syndrome): NEGATIVE
Trisomy 18 (Edwards syndrome): NEGATIVE
Trisomy 21 (Down syndrome): NEGATIVE
XXX (Triple X Syndrome): NOT DETECTED
XXY (Klinefelter Syndrome): NOT DETECTED
XYY (Jacobs Syndrome): NOT DETECTED

## 2020-09-21 ENCOUNTER — Telehealth: Payer: Self-pay | Admitting: Obstetrics and Gynecology

## 2020-09-21 NOTE — Telephone Encounter (Signed)
The patient was informed of the results of her recent MaterniT21 testing which yielded NEGATIVE results.  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  The sensitivity for trisomy 19, trisomy 75 and trisomy 92 using this testing are reported as 99.1%, 99.9% and 91.7% respectively.  Thus, while the results of this testing are highly accurate, they are not considered diagnostic at this time.  Should more definitive information be desired, the patient may still consider amniocentesis.   As requested to know by the patient, sex chromosome analysis was included for this sample.  Results are consistent with a female fetus. This is predicted with >99% accuracy.  A maternal serum AFP only should be considered if screening for neural tube defects is desired.  We may be reached at [phone] with any questions or concerns.   Wilburt Finlay, MS, CGC

## 2020-09-22 ENCOUNTER — Telehealth: Payer: Self-pay

## 2020-09-22 NOTE — Telephone Encounter (Signed)
Call to client to verify no further questions. Per client, genetic counselor called her yesterday and told her testing indicates she is having a girl. Rich Number, RN

## 2020-10-18 NOTE — Addendum Note (Signed)
Addended by: Cletis Media on: 10/18/2020 01:18 PM   Modules accepted: Orders

## 2020-10-24 ENCOUNTER — Other Ambulatory Visit: Payer: Self-pay | Admitting: Advanced Practice Midwife

## 2020-10-24 DIAGNOSIS — Z3482 Encounter for supervision of other normal pregnancy, second trimester: Secondary | ICD-10-CM

## 2020-10-26 ENCOUNTER — Other Ambulatory Visit: Payer: Self-pay

## 2020-10-26 ENCOUNTER — Ambulatory Visit: Payer: Medicaid Other | Attending: Obstetrics

## 2020-10-26 DIAGNOSIS — O321XX0 Maternal care for breech presentation, not applicable or unspecified: Secondary | ICD-10-CM

## 2020-10-26 DIAGNOSIS — Z3482 Encounter for supervision of other normal pregnancy, second trimester: Secondary | ICD-10-CM | POA: Insufficient documentation

## 2020-10-26 DIAGNOSIS — O359XX0 Maternal care for (suspected) fetal abnormality and damage, unspecified, not applicable or unspecified: Secondary | ICD-10-CM

## 2020-10-26 DIAGNOSIS — Z3A18 18 weeks gestation of pregnancy: Secondary | ICD-10-CM | POA: Insufficient documentation

## 2020-10-27 ENCOUNTER — Telehealth: Payer: Self-pay

## 2020-10-27 ENCOUNTER — Ambulatory Visit: Payer: Medicaid Other

## 2020-10-27 ENCOUNTER — Encounter: Payer: Self-pay | Admitting: Advanced Practice Midwife

## 2020-10-27 DIAGNOSIS — Z3481 Encounter for supervision of other normal pregnancy, first trimester: Secondary | ICD-10-CM

## 2020-10-27 NOTE — Telephone Encounter (Signed)
Client [facility] as scheduled in [facility] this am. Call to client and appt rescheduled for 11/02/2020. Rich Number, RN

## 2020-11-02 ENCOUNTER — Encounter: Payer: Self-pay | Admitting: Physician Assistant

## 2020-11-02 ENCOUNTER — Other Ambulatory Visit: Payer: Self-pay

## 2020-11-02 ENCOUNTER — Ambulatory Visit: Payer: Medicaid Other | Admitting: Physician Assistant

## 2020-11-02 VITALS — BP 106/70 | HR 90 | Temp 97.1°F | Wt 168.2 lb

## 2020-11-02 DIAGNOSIS — Z3481 Encounter for supervision of other normal pregnancy, first trimester: Secondary | ICD-10-CM

## 2020-11-02 DIAGNOSIS — F1290 Cannabis use, unspecified, uncomplicated: Secondary | ICD-10-CM

## 2020-11-02 DIAGNOSIS — Z3482 Encounter for supervision of other normal pregnancy, second trimester: Secondary | ICD-10-CM
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
## 2020-11-30 ENCOUNTER — Encounter: Payer: Self-pay | Admitting: Physician Assistant

## 2020-11-30 ENCOUNTER — Ambulatory Visit: Payer: Medicaid Other | Admitting: Physician Assistant

## 2020-11-30 ENCOUNTER — Ambulatory Visit: Payer: Medicaid Other

## 2020-11-30 ENCOUNTER — Other Ambulatory Visit: Payer: Self-pay

## 2020-11-30 DIAGNOSIS — Z3482 Encounter for supervision of other normal pregnancy, second trimester: Secondary | ICD-10-CM | POA: Diagnosis not present

## 2020-11-30 DIAGNOSIS — Z3481 Encounter for supervision of other normal pregnancy, first trimester: Secondary | ICD-10-CM

## 2020-11-30 NOTE — Progress Notes (Signed)
b
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
## 2020-12-21 ENCOUNTER — Ambulatory Visit: Payer: Medicaid Other | Admitting: Family Medicine

## 2020-12-21 ENCOUNTER — Other Ambulatory Visit: Payer: Self-pay

## 2020-12-21 VITALS — BP 94/61 | HR 79 | Temp 97.0°F | Wt 164.0 lb

## 2020-12-21 DIAGNOSIS — O26899 Other specified pregnancy related conditions, unspecified trimester: Secondary | ICD-10-CM

## 2020-12-21 DIAGNOSIS — Z3480 Encounter for supervision of other normal pregnancy, unspecified trimester: Secondary | ICD-10-CM

## 2020-12-21 DIAGNOSIS — O26892 Other specified pregnancy related conditions, second trimester: Secondary | ICD-10-CM

## 2020-12-21 DIAGNOSIS — R519 Headache, unspecified: Secondary | ICD-10-CM

## 2020-12-21 DIAGNOSIS — Z3482 Encounter for supervision of other normal pregnancy, second trimester: Secondary | ICD-10-CM

## 2020-12-21 MED ORDER — CYCLOBENZAPRINE HCL 10 MG PO TABS: 10.0000 mg | ORAL_TABLET | Freq: Three times a day (TID) | ORAL | 0 refills | Status: DC | PRN
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
## 2020-12-21 NOTE — Progress Notes (Signed)
Patient here for MH RV at 26 5/7. States she has had some mucous discharge and is concerned about her mucous plug. Patient counseled about 28 week labs at next appointment.Jenetta Downer, RN

## 2021-01-04 ENCOUNTER — Ambulatory Visit: Payer: Medicaid Other | Admitting: Physician Assistant

## 2021-01-04 ENCOUNTER — Other Ambulatory Visit: Payer: Self-pay

## 2021-01-04 ENCOUNTER — Encounter: Payer: Self-pay | Admitting: Physician Assistant

## 2021-01-04 VITALS — BP 94/69 | HR 101 | Temp 97.5°F | Wt 163.2 lb

## 2021-01-04 DIAGNOSIS — O26893 Other specified pregnancy related conditions, third trimester: Secondary | ICD-10-CM

## 2021-01-04 DIAGNOSIS — O26899 Other specified pregnancy related conditions, unspecified trimester: Secondary | ICD-10-CM

## 2021-01-04 DIAGNOSIS — Z3481 Encounter for supervision of other normal pregnancy, first trimester: Secondary | ICD-10-CM

## 2021-01-04 DIAGNOSIS — Z3483 Encounter for supervision of other normal pregnancy, third trimester: Secondary | ICD-10-CM

## 2021-01-04 DIAGNOSIS — Z741 Need for assistance with personal care: Secondary | ICD-10-CM

## 2021-01-04 DIAGNOSIS — Z23 Encounter for immunization: Secondary | ICD-10-CM | POA: Diagnosis not present

## 2021-01-04 DIAGNOSIS — R519 Headache, unspecified: Secondary | ICD-10-CM

## 2021-01-04 LAB — HEMOGLOBIN, FINGERSTICK: Hemoglobin: 11.3 g/dL (ref 11.1–15.9)
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
## 2021-01-04 NOTE — Progress Notes (Signed)
Patient here for MH RV at 28 5/7. 28 week labs, 1 hour gtt and Tdap today.Marland KitchenMarland KitchenJenetta Downer, RN

## 2021-01-04 NOTE — Progress Notes (Signed)
Tdap given, right deltoid, tolerated well, VIS given. Hgb checked on return from lab, no treatment indicated.Jenetta Downer, RN

## 2021-01-07 ENCOUNTER — Emergency Department
Admission: EM | Admit: 2021-01-07 | Discharge: 2021-01-07 | Disposition: A | Discharge status: Home / Self Care | Payer: Medicaid Other | Attending: Emergency Medicine | Admitting: Emergency Medicine

## 2021-01-07 ENCOUNTER — Emergency Department: Payer: Medicaid Other

## 2021-01-07 DIAGNOSIS — R079 Chest pain, unspecified: Secondary | ICD-10-CM | POA: Diagnosis not present

## 2021-01-07 DIAGNOSIS — R0602 Shortness of breath: Secondary | ICD-10-CM | POA: Insufficient documentation

## 2021-01-07 DIAGNOSIS — Z3A01 Less than 8 weeks gestation of pregnancy: Secondary | ICD-10-CM | POA: Diagnosis not present

## 2021-01-07 DIAGNOSIS — Z87891 Personal history of nicotine dependence: Secondary | ICD-10-CM | POA: Diagnosis not present

## 2021-01-07 DIAGNOSIS — O26891 Other specified pregnancy related conditions, first trimester: Secondary | ICD-10-CM | POA: Insufficient documentation

## 2021-01-07 LAB — TROPONIN I (HIGH SENSITIVITY)
Troponin I (High Sensitivity): <2 ng/L (ref <18)
Troponin I (High Sensitivity): <2 ng/L (ref <18)

## 2021-01-07 LAB — BASIC METABOLIC PANEL
Anion gap: 11 (ref 5–15)
BUN: 5 mg/dL — ABNORMAL LOW (ref 6–20)
CO2: 21 mmol/L — ABNORMAL LOW (ref 22–32)
Calcium: 8.6 mg/dL — ABNORMAL LOW (ref 8.9–10.3)
Chloride: 106 mmol/L (ref 98–111)
Creatinine, Ser: 0.32 mg/dL — ABNORMAL LOW (ref 0.44–1.00)
GFR, Estimated: >60 mL/min (ref >60)
Glucose, Bld: 129 mg/dL — ABNORMAL HIGH (ref 70–99)
Potassium: 3.3 mmol/L — ABNORMAL LOW (ref 3.5–5.1)
Sodium: 138 mmol/L (ref 135–145)

## 2021-01-07 LAB — CBC
HCT: 35.6 % — ABNORMAL LOW (ref 36.0–46.0)
Hemoglobin: 12.2 g/dL (ref 12.0–15.0)
MCH: 29.2 pg (ref 26.0–34.0)
MCHC: 34.3 g/dL (ref 30.0–36.0)
MCV: 85.2 fL (ref 80.0–100.0)
Platelets: 269 10*3/uL (ref 150–400)
RBC: 4.18 MIL/uL (ref 3.87–5.11)
RDW: 13.3 % (ref 11.5–15.5)
WBC: 10.2 10*3/uL (ref 4.0–10.5)
nRBC: 0 % (ref 0.0–0.2)

## 2021-01-07 LAB — D-DIMER, QUANTITATIVE: D-Dimer, Quant: 2.02 ug/mL-FEU — ABNORMAL HIGH (ref 0.00–0.50)

## 2021-01-07 LAB — HCG, QUANTITATIVE, PREGNANCY: hCG, Beta Chain, Quant, S: 31767 m[IU]/mL — ABNORMAL HIGH (ref <5)

## 2021-01-07 MED ORDER — PANTOPRAZOLE SODIUM 20 MG PO TBEC: 20.0000 mg | DELAYED_RELEASE_TABLET | Freq: Every day | ORAL | 1 refills | Status: DC

## 2021-01-07 MED ORDER — IOHEXOL 350 MG/ML SOLN
75.0000 mL | Freq: Once | INTRAVENOUS | Status: AC | PRN
  Administered 2021-01-07: 75 mL via INTRAVENOUS

## 2021-01-07 MED ORDER — LIDOCAINE VISCOUS HCL 2 % MT SOLN
15.0000 mL | Freq: Once | OROMUCOSAL | Status: AC
  Administered 2021-01-07: 15 mL via OROMUCOSAL
  Filled 2021-01-07: qty 15

## 2021-01-07 NOTE — ED Triage Notes (Signed)
Pt to ED POV for centralized cp and shob that started today. +nausea. Denies vomiting. Denies radiation of pain. 7 months pregnant, 2nd pregnancy, no complications Pt tearful in triage

## 2021-01-07 NOTE — Discharge Instructions (Addendum)
Please seek medical attention for any high fevers, chest pain, shortness of breath, change in behavior, persistent vomiting, bloody stool or any other new or concerning symptoms.  
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
## 2021-01-09 ENCOUNTER — Telehealth: Payer: Self-pay

## 2021-01-09 DIAGNOSIS — O99810 Abnormal glucose complicating pregnancy: Secondary | ICD-10-CM | POA: Insufficient documentation

## 2021-01-09 LAB — GLUCOSE, 1 HOUR GESTATIONAL: Gestational Diabetes Screen: 165 mg/dL — ABNORMAL HIGH (ref 65–139)

## 2021-01-09 LAB — HIV-1/HIV-2 QUALITATIVE RNA
HIV-1 RNA, Qualitative: NONREACTIVE
HIV-2 RNA, Qualitative: NONREACTIVE

## 2021-01-09 LAB — RPR: RPR Ser Ql: NONREACTIVE

## 2021-01-09 NOTE — Telephone Encounter (Signed)
One hour glucola (01/04/2021) result = 165 and needs 3 hour GTT. Call to client with above information and 3 hour GTT scheduled for 01/11/2021 with arrival time of 0800. Test prep instructions reviewed and client verbalized understanding. Rich Number, RN

## 2021-01-11 ENCOUNTER — Other Ambulatory Visit: Payer: Self-pay

## 2021-01-11 ENCOUNTER — Other Ambulatory Visit: Payer: Medicaid Other

## 2021-01-11 DIAGNOSIS — O99810 Abnormal glucose complicating pregnancy: Secondary | ICD-10-CM

## 2021-01-11 DIAGNOSIS — O24419 Gestational diabetes mellitus in pregnancy, unspecified control: Secondary | ICD-10-CM

## 2021-01-11 DIAGNOSIS — Z3483 Encounter for supervision of other normal pregnancy, third trimester: Secondary | ICD-10-CM

## 2021-01-11 NOTE — Progress Notes (Signed)
In Nurse Clinic for 3 hr GTT. Pt reports npo since 8 pm last night (01/10/2021). Instructions given for test today. Advised to notify RN if n/v. Josie Saunders, RN

## 2021-01-12 LAB — GLUCOSE TOLERANCE TEST, 6 HOUR
Glucose, 1 Hour GTT: 182 mg/dL (ref 65–199)
Glucose, 2 hour: 214 mg/dL — ABNORMAL HIGH (ref 65–139)
Glucose, 3 hour: 135 mg/dL — ABNORMAL HIGH (ref 65–109)
Glucose, GTT - Fasting: 79 mg/dL (ref 65–99)

## 2021-01-15 ENCOUNTER — Telehealth: Payer: Self-pay

## 2021-01-15 DIAGNOSIS — O24419 Gestational diabetes mellitus in pregnancy, unspecified control: Secondary | ICD-10-CM | POA: Insufficient documentation

## 2021-01-15 HISTORY — DX: Gestational diabetes mellitus in pregnancy, unspecified control: O24.419

## 2021-01-15 MED ORDER — ACCU-CHEK SOFTCLIX LANCETS MISC: 1.0000 | Freq: Four times a day (QID) | 12 refills | Status: DC

## 2021-01-15 MED ORDER — ACCU-CHEK NANO SMARTVIEW W/DEVICE KIT: 1.0000 | PACK | 0 refills | Status: DC

## 2021-01-15 MED ORDER — ACCU-CHEK SMARTVIEW VI STRP: ORAL_STRIP | 12 refills | Status: DC

## 2021-01-15 NOTE — Telephone Encounter (Signed)
Per Dr. Romona Curls order on result note, call client to notify her has GDM, supplies being ordered, [facility] appt being ordered and appt needed in one week to assess blood sugar log. Call to client and left message to call regarding lab results and number to call provided. Rich Number, RN

## 2021-01-15 NOTE — Telephone Encounter (Signed)
Call routed to [facility] pool. Rich Number, RN

## 2021-01-15 NOTE — Addendum Note (Signed)
Addended by: Caryl Bis on: 01/15/2021 01:36 PM   Modules accepted: Orders

## 2021-01-15 NOTE — Telephone Encounter (Signed)
Call to client and counseled regarding 3 hour GTT results => GDM. Counseled to pick up ordered blood glucose testing supplies and take with her to [facility] appt when scheduled. Counseled that [facility] would call her with appt. Client aware of previously scheduled 01/18/2021 [facility] appt. Rich Number, RN

## 2021-01-16 NOTE — Telephone Encounter (Signed)
Per Epic appt desk, client has diabetes class with Ms. [place] 01/19/2021 at 0900. Rich Number, RN

## 2021-01-18 ENCOUNTER — Encounter: Payer: Self-pay | Admitting: Physician Assistant

## 2021-01-18 ENCOUNTER — Ambulatory Visit: Payer: Medicaid Other | Admitting: Physician Assistant

## 2021-01-18 ENCOUNTER — Other Ambulatory Visit: Payer: Self-pay

## 2021-01-18 VITALS — BP 95/59 | HR 87 | Temp 97.3°F | Wt 163.4 lb

## 2021-01-18 DIAGNOSIS — O24419 Gestational diabetes mellitus in pregnancy, unspecified control: Secondary | ICD-10-CM

## 2021-01-18 DIAGNOSIS — O0990 Supervision of high risk pregnancy, unspecified, unspecified trimester: Secondary | ICD-10-CM

## 2021-01-18 LAB — URINALYSIS
Bilirubin, UA: NEGATIVE
Glucose, UA: NEGATIVE
Ketones, UA: NEGATIVE
Leukocytes,UA: NEGATIVE
Nitrite, UA: NEGATIVE
Protein,UA: NEGATIVE
RBC, UA: NEGATIVE
Specific Gravity, UA: 1.01 (ref 1.005–1.030)
Urobilinogen, Ur: 0.2 mg/dL (ref 0.2–1.0)
pH, UA: 6.5 (ref 5.0–7.5)

## 2021-01-18 NOTE — Progress Notes (Signed)
Client plans to retrieve blood glucose testing supplies this am after [facility] RV appt. Aware of [facility] diabetes class 01/19/2021 at 9:00 am. Aware to take testing supplies to [facility] appt. Rich Number, RN  Urine dip reviewed - all normal. Rich Number, RN

## 2021-01-18 NOTE — Addendum Note (Signed)
Addended by: Cletis Media on: 01/18/2021 10:33 AM   Modules accepted: Orders
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
## 2021-01-19 ENCOUNTER — Encounter: Payer: Medicaid Other | Attending: Family Medicine | Admitting: *Deleted

## 2021-01-19 ENCOUNTER — Encounter: Payer: Self-pay | Admitting: *Deleted

## 2021-01-19 VITALS — BP 92/64 | Ht 64.0 in | Wt 163.4 lb

## 2021-01-19 DIAGNOSIS — O24419 Gestational diabetes mellitus in pregnancy, unspecified control: Secondary | ICD-10-CM | POA: Insufficient documentation

## 2021-01-19 DIAGNOSIS — O24410 Gestational diabetes mellitus in pregnancy, diet controlled: Secondary | ICD-10-CM

## 2021-01-19 DIAGNOSIS — Z3A00 Weeks of gestation of pregnancy not specified: Secondary | ICD-10-CM | POA: Insufficient documentation

## 2021-01-19 NOTE — Patient Instructions (Signed)
Read booklet on Gestational Diabetes Follow Gestational Meal Planning Guidelines Don't skip meals - eat 1 protein and 1 carbohydrate serving  Limits desserts and sweets Avoid sugar sweetened drinks (juice, soda, Gatorade) Complete a 3 Day Food Record and bring to next appointment Check blood sugars 4 x day - before breakfast and 2 hrs after every meal and record  Bring blood sugar log to all appointments Purchase urine ketone strips if instructed by MD and check urine ketones every am:  If + increase bedtime snack to 1 protein and 2 carbohydrate servings Walk 20-30 minutes at least 5 x week if permitted by MD

## 2021-01-19 NOTE — Progress Notes (Signed)
Diabetes Self-Management Education  Visit Type: First/Initial  Appt. Start Time: 0900 Appt. End Time: 1025  01/19/2021  [name], identified by name and date of birth, is a 20 y.o. female with a diagnosis of Diabetes: Gestational Diabetes.   ASSESSMENT  Blood pressure 92/64, height 5\' 4"  (1.626 m), weight 163 lb 6.4 oz (74.1 kg), last menstrual period 06/12/2020, estimated date of delivery 0730/2022 Body mass index is 28.05 kg/m.   Diabetes Self-Management Education - 01/19/21 0959      Visit Information   Visit Type First/Initial      Initial Visit   Diabetes Type Gestational Diabetes    Are you currently following a meal plan? No    Are you taking your medications as prescribed? Yes    Date Diagnosed last week      Health Coping   How would you rate your overall health? Good      Psychosocial Assessment   Patient Belief/Attitude about Diabetes Other (comment)   "I haven't had any issues so far so it doesn't bother me"   Self-care barriers None    Self-management support Doctor's office    Patient Concerns Nutrition/Meal planning;Glycemic Control;Monitoring;Healthy Lifestyle    Special Needs None    Preferred Learning Style Auditory;Visual    Learning Readiness Ready    How often do you need to have someone help you when you read instructions, pamphlets, or other written materials from your doctor or pharmacy? 1 - Never    What is the last grade level you completed in school? 8th grade      Pre-Education Assessment   Patient understands the diabetes disease and treatment process. Needs Instruction    Patient understands incorporating nutritional management into lifestyle. Needs Instruction    Patient undertands incorporating physical activity into lifestyle. Needs Instruction    Patient understands using medications safely. Needs Instruction    Patient understands monitoring blood glucose, interpreting and using results Needs Instruction    Patient understands  prevention, detection, and treatment of acute complications. Needs Instruction    Patient understands prevention, detection, and treatment of chronic complications. Needs Instruction    Patient understands how to develop strategies to address psychosocial issues. Needs Instruction    Patient understands how to develop strategies to promote health/change behavior. Needs Instruction      Complications   How often do you check your blood sugar? 0 times/day (not testing)   Provided Accu-Chek Guide Me meter and instructed on use. BG upon return demonstration was 87 mg/dL at 10:15 am - fasting. She already picked up strips and lancets from her pharmacy.   Have you had a dilated eye exam in the past 12 months? No    Have you had a dental exam in the past 12 months? No    Are you checking your feet? No      Dietary Intake   Breakfast skips or eats cereal and milk    Snack (morning) reports 0-1 snacks/day - fruit (cantaloup, strawberries)    Lunch pasta, [place] or bolgna sandwich    Dinner beef, occasional chicken; potatoes, peas, beans, corn, occasional green beans, pasta, broccoli, cuccumbers, carrots, salad with lettuce, olives, pickles, ham and [place]    Beverage(s) water, juice, soda, Gatorade, milk      Exercise   Exercise Type Light (walking / raking leaves)    How many days per week to you exercise? 3    How many minutes per day do you exercise? [place]  Total minutes per week of exercise 90      Patient Education   Previous Diabetes Education No    Disease state  Definition of diabetes, type 1 and 2, and the diagnosis of diabetes;Factors that contribute to the development of diabetes    Nutrition management  Role of diet in the treatment of diabetes and the relationship between the three main macronutrients and blood glucose level;Food label reading, portion sizes and measuring food.;Reviewed blood glucose goals for pre and post meals and how to evaluate the patients' food intake on their  blood glucose level.    Physical activity and exercise  Role of exercise on diabetes management, blood pressure control and cardiac health.    Medications Other (comment)   Limited use of oral medications during pregnancy and potential for insulin.   Monitoring Taught/evaluated SMBG meter.;Purpose and frequency of SMBG.;Taught/discussed recording of test results and interpretation of SMBG.;Ketone testing, when, how.    Chronic complications Relationship between chronic complications and blood glucose control    Psychosocial adjustment Identified and addressed patients feelings and concerns about diabetes;Other (comment)   she denied need for stress counselor   Preconception care Pregnancy and GDM  Role of pre-pregnancy blood glucose control on the development of the fetus;Reviewed with patient blood glucose goals with pregnancy;Role of family planning for patients with diabetes      Individualized Goals (developed by patient)   Reducing Risk Other (comment)   improve blood sugars, prevent diabetes complications, lead a healthier lifestyle, become more fit     Outcomes   Expected Outcomes Demonstrated interest in learning. Expect positive outcomes    Future DMSE 2 wks           Individualized Plan for Diabetes Self-Management Training:   Learning Objective:  Patient will have a greater understanding of diabetes self-management. Patient education plan is to attend individual and/or group sessions per assessed needs and concerns.   Plan:   Patient Instructions  Read booklet on Gestational Diabetes Follow Gestational Meal Planning Guidelines Don't skip meals - eat 1 protein and 1 carbohydrate serving  Limits desserts and sweets Avoid sugar sweetened drinks (juice, soda, Gatorade) Complete a 3 Day Food Record and bring to next appointment Check blood sugars 4 x day - before breakfast and 2 hrs after every meal and record  Bring blood sugar log to all appointments Purchase urine ketone  strips if instructed by MD and check urine ketones every am:  If + increase bedtime snack to 1 protein and 2 carbohydrate servings Walk 20-30 minutes at least 5 x week if permitted by MD  Expected Outcomes:  Demonstrated interest in learning. Expect positive outcomes  Education material provided:  Gestational Booklet Gestational Meal Planning Guidelines Simple Meal Plan Viewed Gestational Diabetes Video Meter = Accu-Chek Guide Me 3 Day Food Record Goals for a Healthy Pregnancy  If problems or questions, patient to contact team via:   Johny Drilling, RN, [place] [phone]  Future DSME appointment: 2 wks  February 01, 2021 with the dietitian

## 2021-01-29 ENCOUNTER — Encounter: Payer: Self-pay | Admitting: Advanced Practice Midwife

## 2021-02-01 ENCOUNTER — Encounter: Payer: Self-pay | Admitting: Physician Assistant

## 2021-02-01 ENCOUNTER — Other Ambulatory Visit: Payer: Self-pay

## 2021-02-01 ENCOUNTER — Ambulatory Visit: Payer: Medicaid Other | Admitting: Dietician

## 2021-02-01 ENCOUNTER — Ambulatory Visit: Payer: Medicaid Other | Admitting: Physician Assistant

## 2021-02-01 VITALS — BP 90/62 | HR 107 | Temp 97.6°F | Wt 162.4 lb

## 2021-02-01 DIAGNOSIS — O24410 Gestational diabetes mellitus in pregnancy, diet controlled: Secondary | ICD-10-CM

## 2021-02-01 DIAGNOSIS — Z3483 Encounter for supervision of other normal pregnancy, third trimester: Secondary | ICD-10-CM

## 2021-02-01 LAB — URINALYSIS
Bilirubin, UA: POSITIVE — AB
Glucose, UA: NEGATIVE
Leukocytes,UA: NEGATIVE
Nitrite, UA: NEGATIVE
Protein,UA: NEGATIVE
RBC, UA: NEGATIVE
Specific Gravity, UA: 1.03 (ref 1.005–1.030)
Urobilinogen, Ur: 1 mg/dL (ref 0.2–1.0)
pH, UA: 6 (ref 5.0–7.5)

## 2021-02-01 NOTE — Progress Notes (Signed)
C/O post-nasal drip with mild sore throat, sinus HA with pressure and states feels like I have a sinus infection. Symptoms began last week. Client requested to wear mask due to above and mask given. Kept 01/19/2021 [facility] appt and aware of 1600 appt today with [facility]. Rich Number, RN Urine dip reviewed by A. Streilein PA-C. Rich Number, RN
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
## 2021-02-15 ENCOUNTER — Encounter: Payer: Self-pay | Admitting: Physician Assistant

## 2021-02-15 ENCOUNTER — Other Ambulatory Visit: Payer: Self-pay

## 2021-02-15 ENCOUNTER — Ambulatory Visit: Payer: Medicaid Other | Admitting: Physician Assistant

## 2021-02-15 VITALS — BP 90/60 | HR 94 | Temp 97.3°F | Wt 162.4 lb

## 2021-02-15 DIAGNOSIS — Z3483 Encounter for supervision of other normal pregnancy, third trimester: Secondary | ICD-10-CM

## 2021-02-15 DIAGNOSIS — O24410 Gestational diabetes mellitus in pregnancy, diet controlled: Secondary | ICD-10-CM

## 2021-02-15 LAB — URINALYSIS
Bilirubin, UA: POSITIVE — AB
Glucose, UA: NEGATIVE
Leukocytes,UA: NEGATIVE
Nitrite, UA: NEGATIVE
Protein,UA: NEGATIVE
RBC, UA: NEGATIVE
Specific Gravity, UA: 1.03 (ref 1.005–1.030)
Urobilinogen, Ur: 1 mg/dL (ref 0.2–1.0)
pH, UA: 6 (ref 5.0–7.5)

## 2021-02-15 NOTE — Progress Notes (Addendum)
Patient here for MH RV at [redacted]w[redacted]d  Patient brought blood sugar log today, copy made and handed over to provider, A. Streilein, PA-C.   Appointment reminder card given to patient for [facility] lifestyle appointment at 0930.   Urine dip reviewed with provider during clinic visit.   Adalberto Cole, RN
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
## 2021-02-16 ENCOUNTER — Encounter: Payer: Medicaid Other | Attending: Family Medicine | Admitting: Dietician

## 2021-02-16 ENCOUNTER — Encounter: Payer: Self-pay | Admitting: Dietician

## 2021-02-16 VITALS — Ht 64.0 in | Wt 162.8 lb

## 2021-02-16 DIAGNOSIS — O24410 Gestational diabetes mellitus in pregnancy, diet controlled: Secondary | ICD-10-CM | POA: Diagnosis present

## 2021-02-16 NOTE — Progress Notes (Signed)
Patient's BG record indicates fasting BGs ranging 78-95, and post-meal BGs ranging 90-134, + 2 (of 37) readings at 143, 187 after drinking sugary beverages. She denies any episodes of hypoglycemia. Patient's food diary indicates she is eating at regular intervals, including protein sources with all meals, and generally controlling carb intake. Reviewed goals for carb intake with meals and snacks.  Discussed pregnancy weight gain, importance of adequate nutrition during pregnancy as well as when breastfeeding. Encouraged increased protein and/or vegetable intake with meals if needed and discussed options. Provided basic balanced meal plan, and wrote individualized menus based on patient's food preferences. Instructed patient on food safety, including avoidance of Listeriosis, and limiting mercury from fish (she states she does not eat seafood). Discussed importance of maintaining healthy lifestyle habits to reduce risk of Type 2 DM as well as Gestational DM with any future pregnancies. Advised patient to use any remaining testing supplies to test some BGs after delivery, and to have BG tested ideally annually, as well as prior to attempting future pregnancies.

## 2021-02-16 NOTE — Patient Instructions (Signed)
Great job eating meals and snacks at regular intervals, continue to eat something every 3-5 hours during the day.  Allow extra protein and/or veggies if needed to feel full enough after meals.

## 2021-03-01 ENCOUNTER — Telehealth: Payer: Self-pay

## 2021-03-01 ENCOUNTER — Ambulatory Visit: Payer: Medicaid Other

## 2021-03-01 NOTE — Telephone Encounter (Signed)
TC to patient to reschedule missed MH RV on 03/01/2021. LM with number to call.Jenetta Downer, RN

## 2021-03-02 NOTE — Telephone Encounter (Signed)
Per [facility] Epic, client has [place] RV appt 03/06/2021. Rich Number, RN

## 2021-03-06 ENCOUNTER — Other Ambulatory Visit: Payer: Self-pay

## 2021-03-06 ENCOUNTER — Ambulatory Visit: Payer: Medicaid Other | Admitting: Advanced Practice Midwife

## 2021-03-06 VITALS — BP 91/61 | HR 104 | Temp 97.5°F | Wt 163.8 lb

## 2021-03-06 DIAGNOSIS — O99343 Other mental disorders complicating pregnancy, third trimester: Secondary | ICD-10-CM

## 2021-03-06 DIAGNOSIS — O24410 Gestational diabetes mellitus in pregnancy, diet controlled: Secondary | ICD-10-CM

## 2021-03-06 DIAGNOSIS — F5089 Other specified eating disorder: Secondary | ICD-10-CM

## 2021-03-06 DIAGNOSIS — Z91199 Patient's noncompliance with other medical treatment and regimen due to unspecified reason: Secondary | ICD-10-CM | POA: Insufficient documentation

## 2021-03-06 DIAGNOSIS — Z9119 Patient's noncompliance with other medical treatment and regimen: Secondary | ICD-10-CM

## 2021-03-06 DIAGNOSIS — Z3483 Encounter for supervision of other normal pregnancy, third trimester: Secondary | ICD-10-CM

## 2021-03-06 DIAGNOSIS — O24419 Gestational diabetes mellitus in pregnancy, unspecified control: Secondary | ICD-10-CM

## 2021-03-06 DIAGNOSIS — F32A Depression, unspecified: Secondary | ICD-10-CM

## 2021-03-06 LAB — URINALYSIS
Bilirubin, UA: NEGATIVE
Glucose, UA: NEGATIVE
Leukocytes,UA: NEGATIVE
Nitrite, UA: NEGATIVE
Protein,UA: NEGATIVE
Specific Gravity, UA: 1.025 (ref 1.005–1.030)
Urobilinogen, Ur: 0.2 mg/dL (ref 0.2–1.0)
pH, UA: 6.5 (ref 5.0–7.5)
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
## 2021-03-06 NOTE — Progress Notes (Addendum)
Patient here at [place] 3 d for MH RV.   Blood sugar log brought in by patient, copy made and handed to provider, E. Sciora, CNM.    Urine dip done today. Provider reviewed.   GBS and gc/chlamydia patient self collected.   Adalberto Cole, RN

## 2021-03-06 NOTE — Progress Notes (Signed)
[place] order received by provider Ola Spurr, CNM. [place] order faxed to [facility] [place] with confirmation received. Order canceled.   [place] order to [facility], electronic order placed by provider. Paper referral faxed to [facility] with confirmation at [phone].   Adalberto Cole, RN

## 2021-03-07 ENCOUNTER — Telehealth: Payer: Self-pay

## 2021-03-07 NOTE — Telephone Encounter (Signed)
Phone call to patient to inform of scheduled MFM ultrasound appt for 04/17/2021 @ 1:00. Patient's EDD is 03/24/2021. RN left message with MFM scheduling line regarding need to reschedule appt to earlier date. Informed patient of same information and will call as soon as appt has been rescheduled. Hal Morales, RN

## 2021-03-08 NOTE — Telephone Encounter (Signed)
Call from receptionist [facility] at [facility] and [place] (interval growth, AFI, EFW) scheduled for 03/15/2021 with arrival time of 2:45 pm. Per Littie Deeds (RN) has arranged for client to have above at [facility] in [employer].  Per Loma Sousa, if client misses appt there are no other readily avaliable [place] appts due to a full schedule.  Call to client and left message to call [facility] for [place] appt and number to call provided. Rich Number, RN

## 2021-03-08 NOTE — Telephone Encounter (Signed)
Call to [facility] and spoke with receptionist [facility] regarding need for [place] appt prior to 04/17/2021 (EDD = 03/24/21) Per Littie Deeds (RN) is aware of need for appt, has referral and is reviewing schedule for an appt as schedule is booked out pretty far. Rich Number, RN

## 2021-03-09 NOTE — Telephone Encounter (Signed)
Call to client and left message to call ASAP regarding [place] appt. Rich Number, RN

## 2021-03-10 LAB — CULTURE, BETA STREP (GROUP B ONLY): Strep Gp B Culture: NEGATIVE

## 2021-03-10 LAB — CHLAMYDIA/GC NAA, CONFIRMATION
Chlamydia trachomatis, NAA: NEGATIVE
Neisseria gonorrhoeae, NAA: NEGATIVE

## 2021-03-12 NOTE — Telephone Encounter (Signed)
Call to client to give her [place] appt which is scheduled for 03/15/2021. Left voicemail message requesting she call [facility] for [place] appt and number provided. Left appt reminder regarding MHC RV appt 03/13/2021.Rich Number, RN

## 2021-03-13 ENCOUNTER — Other Ambulatory Visit: Payer: Self-pay

## 2021-03-13 ENCOUNTER — Ambulatory Visit: Payer: Medicaid Other | Admitting: Advanced Practice Midwife

## 2021-03-13 VITALS — BP 102/71 | HR 107 | Temp 97.6°F | Wt 164.8 lb

## 2021-03-13 DIAGNOSIS — F5089 Other specified eating disorder: Secondary | ICD-10-CM

## 2021-03-13 DIAGNOSIS — F32A Depression, unspecified: Secondary | ICD-10-CM

## 2021-03-13 DIAGNOSIS — F1290 Cannabis use, unspecified, uncomplicated: Secondary | ICD-10-CM

## 2021-03-13 DIAGNOSIS — O99343 Other mental disorders complicating pregnancy, third trimester: Secondary | ICD-10-CM

## 2021-03-13 DIAGNOSIS — Z3483 Encounter for supervision of other normal pregnancy, third trimester: Secondary | ICD-10-CM

## 2021-03-13 DIAGNOSIS — Z9119 Patient's noncompliance with other medical treatment and regimen: Secondary | ICD-10-CM

## 2021-03-13 DIAGNOSIS — Z91199 Patient's noncompliance with other medical treatment and regimen due to unspecified reason: Secondary | ICD-10-CM

## 2021-03-13 DIAGNOSIS — O24419 Gestational diabetes mellitus in pregnancy, unspecified control: Secondary | ICD-10-CM

## 2021-03-13 LAB — URINALYSIS
Bilirubin, UA: NEGATIVE
Glucose, UA: NEGATIVE
Ketones, UA: NEGATIVE
Nitrite, UA: NEGATIVE
Protein,UA: NEGATIVE
Specific Gravity, UA: 1.02 (ref 1.005–1.030)
Urobilinogen, Ur: 0.2 mg/dL (ref 0.2–1.0)
pH, UA: 6.5 (ref 5.0–7.5)

## 2021-03-13 NOTE — Progress Notes (Signed)
Here today for 38.3 week MH RV. Taking PNV QD. Denies ED/hospital visits since last RV. UA today. BS log copy in chart. Hal Morales, RN
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
## 2021-03-14 LAB — 789231 7+OXYCODONE-BUND
Amphetamines, Urine: NEGATIVE ng/mL
BENZODIAZ UR QL: NEGATIVE ng/mL
Barbiturate screen, urine: NEGATIVE ng/mL
Cannabinoid Quant, Ur: NEGATIVE ng/mL
Cocaine (Metab.): NEGATIVE ng/mL
OPIATE SCREEN URINE: NEGATIVE ng/mL
Oxycodone/Oxymorphone, Urine: NEGATIVE ng/mL
PCP Quant, Ur: NEGATIVE ng/mL

## 2021-03-15 ENCOUNTER — Ambulatory Visit: Payer: Medicaid Other | Attending: Maternal & Fetal Medicine

## 2021-03-15 ENCOUNTER — Other Ambulatory Visit: Payer: Self-pay

## 2021-03-15 ENCOUNTER — Other Ambulatory Visit: Payer: Self-pay | Admitting: Maternal & Fetal Medicine

## 2021-03-15 DIAGNOSIS — Z3483 Encounter for supervision of other normal pregnancy, third trimester: Secondary | ICD-10-CM

## 2021-03-15 DIAGNOSIS — E109 Type 1 diabetes mellitus without complications: Secondary | ICD-10-CM

## 2021-03-15 DIAGNOSIS — F1290 Cannabis use, unspecified, uncomplicated: Secondary | ICD-10-CM

## 2021-03-15 DIAGNOSIS — O24410 Gestational diabetes mellitus in pregnancy, diet controlled: Secondary | ICD-10-CM | POA: Diagnosis not present

## 2021-03-15 DIAGNOSIS — O99323 Drug use complicating pregnancy, third trimester: Secondary | ICD-10-CM | POA: Diagnosis not present

## 2021-03-15 DIAGNOSIS — Z3A38 38 weeks gestation of pregnancy: Secondary | ICD-10-CM | POA: Diagnosis not present

## 2021-03-19 NOTE — Addendum Note (Signed)
Addended by: Cletis Media on: 03/19/2021 12:10 PM   Modules accepted: Orders

## 2021-03-20 ENCOUNTER — Other Ambulatory Visit: Payer: Self-pay

## 2021-03-20 ENCOUNTER — Ambulatory Visit: Payer: Medicaid Other | Admitting: Family Medicine

## 2021-03-20 VITALS — BP 102/65 | HR 109 | Temp 98.6°F | Wt 167.0 lb

## 2021-03-20 DIAGNOSIS — F32A Depression, unspecified: Secondary | ICD-10-CM

## 2021-03-20 DIAGNOSIS — Z3483 Encounter for supervision of other normal pregnancy, third trimester: Secondary | ICD-10-CM

## 2021-03-20 DIAGNOSIS — O99343 Other mental disorders complicating pregnancy, third trimester: Secondary | ICD-10-CM

## 2021-03-20 DIAGNOSIS — O24419 Gestational diabetes mellitus in pregnancy, unspecified control: Secondary | ICD-10-CM

## 2021-03-20 LAB — URINALYSIS
Bilirubin, UA: NEGATIVE
Glucose, UA: NEGATIVE
Ketones, UA: NEGATIVE
Nitrite, UA: NEGATIVE
Protein,UA: NEGATIVE
RBC, UA: NEGATIVE
Specific Gravity, UA: 1.02 (ref 1.005–1.030)
Urobilinogen, Ur: 0.2 mg/dL (ref 0.2–1.0)
pH, UA: 7 (ref 5.0–7.5)
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
## 2021-03-20 NOTE — Progress Notes (Signed)
Patient here at 36w 3d for [facility]  Patient brought in blood sugar log, copy made and handed over to provider.   Urine dip reviewed today.   Adalberto Cole, RN

## 2021-03-20 NOTE — Progress Notes (Signed)
IOL form faxed with confirmation to [facility].   Adalberto Cole, RN

## 2021-03-21 ENCOUNTER — Other Ambulatory Visit: Payer: Self-pay | Admitting: Obstetrics & Gynecology

## 2021-03-21 ENCOUNTER — Telehealth: Payer: Self-pay

## 2021-03-21 ENCOUNTER — Encounter: Payer: Self-pay | Admitting: Advanced Practice Midwife

## 2021-03-21 NOTE — Telephone Encounter (Signed)
Per Ola Spurr CNM, call received from Dr. Ouida Sills with IOL date of 04/03/2021 at 0500 and client needs Covid test 04/02/2021. Call to client with above information and counseled where / when to obtain Covid test at [facility]. Rich Number, RN

## 2021-03-27 ENCOUNTER — Ambulatory Visit: Payer: Medicaid Other | Admitting: Family Medicine

## 2021-03-27 ENCOUNTER — Other Ambulatory Visit: Payer: Self-pay

## 2021-03-27 VITALS — BP 100/65 | HR 91 | Temp 97.5°F | Wt 168.8 lb

## 2021-03-27 DIAGNOSIS — O24419 Gestational diabetes mellitus in pregnancy, unspecified control: Secondary | ICD-10-CM

## 2021-03-27 DIAGNOSIS — O0990 Supervision of high risk pregnancy, unspecified, unspecified trimester: Secondary | ICD-10-CM

## 2021-03-27 DIAGNOSIS — O0993 Supervision of high risk pregnancy, unspecified, third trimester: Secondary | ICD-10-CM

## 2021-03-27 LAB — URINALYSIS
Bilirubin, UA: NEGATIVE
Glucose, UA: NEGATIVE
Ketones, UA: NEGATIVE
Leukocytes,UA: NEGATIVE
Nitrite, UA: NEGATIVE
Protein,UA: NEGATIVE
RBC, UA: NEGATIVE
Specific Gravity, UA: 1.015 (ref 1.005–1.030)
Urobilinogen, Ur: 0.2 mg/dL (ref 0.2–1.0)
pH, UA: 7.5 (ref 5.0–7.5)

## 2021-03-27 NOTE — Progress Notes (Signed)
Patient here for [place] [facility] at 40w 3d.   IOL date and time given to patient (04/03/21 at 5am). Patient aware to arrive at [facility] ED entrance and she is also aware to Covid test at [facility] the day prior.   Blood sugar log copy made and given to provider for review.   Urine dip reviewed with provider during clinic visit.   Adalberto Cole, RN
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
## 2021-03-30 ENCOUNTER — Other Ambulatory Visit: Payer: Self-pay

## 2021-03-30 ENCOUNTER — Inpatient Hospital Stay
Admission: EM | Admit: 2021-03-30 | Discharge: 2021-03-31 | DRG: 806 | Disposition: A | Discharge status: Home / Self Care | Payer: Medicaid Other | Attending: Obstetrics and Gynecology | Admitting: Obstetrics and Gynecology

## 2021-03-30 ENCOUNTER — Encounter: Payer: Self-pay | Admitting: Obstetrics & Gynecology

## 2021-03-30 DIAGNOSIS — O24420 Gestational diabetes mellitus in childbirth, diet controlled: Secondary | ICD-10-CM | POA: Diagnosis present

## 2021-03-30 DIAGNOSIS — O480 Post-term pregnancy: Principal | ICD-10-CM | POA: Diagnosis present

## 2021-03-30 DIAGNOSIS — O26893 Other specified pregnancy related conditions, third trimester: Secondary | ICD-10-CM | POA: Diagnosis present

## 2021-03-30 DIAGNOSIS — F32A Other mental disorders complicating pregnancy, third trimester: Secondary | ICD-10-CM | POA: Diagnosis present

## 2021-03-30 DIAGNOSIS — Z87891 Personal history of nicotine dependence: Secondary | ICD-10-CM | POA: Diagnosis not present

## 2021-03-30 DIAGNOSIS — F1290 Cannabis use, unspecified, uncomplicated: Secondary | ICD-10-CM | POA: Diagnosis present

## 2021-03-30 DIAGNOSIS — O24410 Gestational diabetes mellitus in pregnancy, diet controlled: Secondary | ICD-10-CM | POA: Diagnosis not present

## 2021-03-30 DIAGNOSIS — O99343 Other mental disorders complicating pregnancy, third trimester: Secondary | ICD-10-CM | POA: Diagnosis present

## 2021-03-30 DIAGNOSIS — O24419 Gestational diabetes mellitus in pregnancy, unspecified control: Secondary | ICD-10-CM | POA: Diagnosis present

## 2021-03-30 DIAGNOSIS — O0993 Supervision of high risk pregnancy, unspecified, third trimester: Secondary | ICD-10-CM

## 2021-03-30 DIAGNOSIS — O99324 Drug use complicating childbirth: Secondary | ICD-10-CM | POA: Diagnosis present

## 2021-03-30 DIAGNOSIS — Z3483 Encounter for supervision of other normal pregnancy, third trimester: Secondary | ICD-10-CM

## 2021-03-30 DIAGNOSIS — Z3A40 40 weeks gestation of pregnancy: Secondary | ICD-10-CM | POA: Diagnosis not present

## 2021-03-30 DIAGNOSIS — Z20822 Contact with and (suspected) exposure to covid-19: Secondary | ICD-10-CM | POA: Diagnosis present

## 2021-03-30 LAB — CBC
HCT: 32.8 % — ABNORMAL LOW (ref 36.0–46.0)
Hemoglobin: 10.8 g/dL — ABNORMAL LOW (ref 12.0–15.0)
MCH: 27.8 pg (ref 26.0–34.0)
MCHC: 32.9 g/dL (ref 30.0–36.0)
MCV: 84.5 fL (ref 80.0–100.0)
Platelets: 297 10*3/uL (ref 150–400)
RBC: 3.88 MIL/uL (ref 3.87–5.11)
RDW: 14.1 % (ref 11.5–15.5)
WBC: 18.6 10*3/uL — ABNORMAL HIGH (ref 4.0–10.5)
nRBC: 0 % (ref 0.0–0.2)

## 2021-03-30 LAB — TYPE AND SCREEN
ABO/RH(D): O POS
Antibody Screen: NEGATIVE

## 2021-03-30 LAB — RESP PANEL BY RT-PCR (FLU A&B, COVID) ARPGX2
Influenza A by PCR: NEGATIVE
Influenza B by PCR: NEGATIVE
SARS Coronavirus 2 by RT PCR: NEGATIVE

## 2021-03-30 MED ORDER — LIDOCAINE HCL (PF) 1 % IJ SOLN
INTRAMUSCULAR | Status: AC
  Filled 2021-03-30: qty 30

## 2021-03-30 MED ORDER — MISOPROSTOL 25 MCG QUARTER TABLET: 25.0000 ug | ORAL_TABLET | ORAL | Status: DC | PRN

## 2021-03-30 MED ORDER — ONDANSETRON HCL 4 MG/2ML IJ SOLN: 4.0000 mg | Freq: Four times a day (QID) | INTRAMUSCULAR | Status: DC | PRN

## 2021-03-30 MED ORDER — DIBUCAINE (PERIANAL) 1 % EX OINT: 1.0000 "application " | TOPICAL_OINTMENT | CUTANEOUS | Status: DC | PRN

## 2021-03-30 MED ORDER — OXYCODONE HCL 5 MG PO TABS
5.0000 mg | ORAL_TABLET | ORAL | Status: DC | PRN
  Administered 2021-03-30: 5 mg via ORAL
  Filled 2021-03-30: qty 1

## 2021-03-30 MED ORDER — BUTORPHANOL TARTRATE 1 MG/ML IJ SOLN: 1.0000 mg | INTRAMUSCULAR | Status: DC | PRN

## 2021-03-30 MED ORDER — LIDOCAINE HCL (PF) 1 % IJ SOLN: 30.0000 mL | INTRAMUSCULAR | Status: DC | PRN

## 2021-03-30 MED ORDER — OXYTOCIN 10 UNIT/ML IJ SOLN
INTRAMUSCULAR | Status: AC
  Filled 2021-03-30: qty 2

## 2021-03-30 MED ORDER — ACETAMINOPHEN 325 MG PO TABS: 650.0000 mg | ORAL_TABLET | ORAL | Status: DC | PRN

## 2021-03-30 MED ORDER — PRENATAL MULTIVITAMIN CH
1.0000 | ORAL_TABLET | Freq: Every day | ORAL | Status: DC
  Administered 2021-03-30 – 2021-03-31 (×2): 1 via ORAL
  Filled 2021-03-30 (×2): qty 1

## 2021-03-30 MED ORDER — AMMONIA AROMATIC IN INHA
RESPIRATORY_TRACT | Status: AC
  Filled 2021-03-30: qty 10

## 2021-03-30 MED ORDER — TETANUS-DIPHTH-ACELL PERTUSSIS 5-2.5-18.5 LF-MCG/0.5 IM SUSY
0.5000 mL | PREFILLED_SYRINGE | Freq: Once | INTRAMUSCULAR | Status: DC
  Filled 2021-03-30: qty 0.5

## 2021-03-30 MED ORDER — TERBUTALINE SULFATE 1 MG/ML IJ SOLN: 0.2500 mg | Freq: Once | INTRAMUSCULAR | Status: DC | PRN

## 2021-03-30 MED ORDER — ONDANSETRON HCL 4 MG/2ML IJ SOLN: 4.0000 mg | INTRAMUSCULAR | Status: DC | PRN

## 2021-03-30 MED ORDER — IBUPROFEN 600 MG PO TABS
600.0000 mg | ORAL_TABLET | Freq: Four times a day (QID) | ORAL | Status: DC
  Administered 2021-03-30 – 2021-03-31 (×5): 600 mg via ORAL
  Filled 2021-03-30 (×5): qty 1

## 2021-03-30 MED ORDER — DIPHENHYDRAMINE HCL 25 MG PO CAPS: 25.0000 mg | ORAL_CAPSULE | Freq: Four times a day (QID) | ORAL | Status: DC | PRN

## 2021-03-30 MED ORDER — MISOPROSTOL 200 MCG PO TABS
ORAL_TABLET | ORAL | Status: AC
  Filled 2021-03-30: qty 4

## 2021-03-30 MED ORDER — SIMETHICONE 80 MG PO CHEW: 80.0000 mg | CHEWABLE_TABLET | ORAL | Status: DC | PRN

## 2021-03-30 MED ORDER — COCONUT OIL OIL
1.0000 "application " | TOPICAL_OIL | Status: DC | PRN
  Filled 2021-03-30: qty 120

## 2021-03-30 MED ORDER — OXYTOCIN-SODIUM CHLORIDE 30-0.9 UT/500ML-% IV SOLN: 2.5000 [IU]/h | INTRAVENOUS | Status: DC

## 2021-03-30 MED ORDER — ZOLPIDEM TARTRATE 5 MG PO TABS: 5.0000 mg | ORAL_TABLET | Freq: Every evening | ORAL | Status: DC | PRN

## 2021-03-30 MED ORDER — WITCH HAZEL-GLYCERIN EX PADS: 1.0000 "application " | MEDICATED_PAD | CUTANEOUS | Status: DC | PRN

## 2021-03-30 MED ORDER — ONDANSETRON HCL 4 MG PO TABS
4.0000 mg | ORAL_TABLET | ORAL | Status: DC | PRN
  Filled 2021-03-30: qty 1

## 2021-03-30 MED ORDER — LACTATED RINGERS IV SOLN: 500.0000 mL | INTRAVENOUS | Status: DC | PRN

## 2021-03-30 MED ORDER — BENZOCAINE-MENTHOL 20-0.5 % EX AERO: 1.0000 "application " | INHALATION_SPRAY | CUTANEOUS | Status: DC | PRN

## 2021-03-30 MED ORDER — OXYTOCIN BOLUS FROM INFUSION: 333.0000 mL | Freq: Once | INTRAVENOUS | Status: DC

## 2021-03-30 MED ORDER — LACTATED RINGERS IV SOLN: INTRAVENOUS | Status: DC

## 2021-03-30 MED ORDER — DOCUSATE SODIUM 100 MG PO CAPS
100.0000 mg | ORAL_CAPSULE | Freq: Two times a day (BID) | ORAL | Status: DC
  Administered 2021-03-31: 100 mg via ORAL
  Filled 2021-03-30: qty 1

## 2021-03-30 NOTE — Lactation Note (Signed)
This note was copied from a baby's chart. Lactation Consultation Note  Patient Name: Girl Hessie Elting [id number] Date: 03/30/2021 Reason for consult: Follow-up assessment Age:22 hours  Maternal Data Has patient been taught Hand Expression?: Yes Does the patient have breastfeeding experience prior to this delivery?: Yes How long did the patient breastfeed?: 2 wks  Feeding Mother's Current Feeding Choice: Breast Milk Mom had expressed colostrum from left breast, baby stimulated to awaken, latch after two attempts to breast, sleepy at breast, needing stimulation, but maintaining latch, mom shown how to support breast to maintain latch LATCH Score Latch: Grasps breast easily, tongue down, lips flanged, rhythmical sucking.  Audible Swallowing: A few with stimulation  Type of Nipple: Everted at rest and after stimulation  Comfort (Breast/Nipple): Soft / non-tender  Hold (Positioning): Assistance needed to correctly position infant at breast and maintain latch.  LATCH Score: 8   Lactation Tools Discussed/Used    Interventions Interventions: Assisted with latch;Skin to skin;Hand express;Education  Discharge Pump: Personal [place] Program: Yes  Consult Status Consult Status: Follow-up Date: 03/31/21 Follow-up type: In-patient    Ferol Luz 03/30/2021, 5:50 PM

## 2021-03-30 NOTE — Lactation Note (Addendum)
This note was copied from a baby's chart. Lactation Consultation Note  Patient Name: Girl Edita Keach [id number] Date: 03/30/2021 Reason for consult: Initial assessment;Term Age:22 hours  Maternal Data Has patient been taught Hand Expression?: Yes Does the patient have breastfeeding experience prior to this delivery?: Yes How long did the patient breastfeed?: 2 wks  Feeding Mother's Current Feeding Choice: Breast Milk Attempted latch after no feed since 1100 after birth, baby sleepy, will open mouth and hold nipple in mouth, but no sucking despite stimulation, left skin to skin, mom to watch for feeding cues  LATCH Score Latch: Too sleepy or reluctant, no latch achieved, no sucking elicited.  Audible Swallowing: None  Type of Nipple: Everted at rest and after stimulation  Comfort (Breast/Nipple): Soft / non-tender  Hold (Positioning): Assistance needed to correctly position infant at breast and maintain latch.  LATCH Score: 5   Lactation Tools Discussed/Used  [place] name and no written on white board  Interventions Interventions: Breast feeding basics reviewed;Assisted with latch;Skin to skin;Hand express;Education  Discharge [place] Program: Yes  Consult Status Consult Status: Follow-up Date: 03/30/21 Follow-up type: In-patient    Ferol Luz 03/30/2021, 3:34 PM

## 2021-03-30 NOTE — Discharge Summary (Signed)
   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 yo G2P2002 at [redacted]w[redacted]d was admitted in Active Labor on 03/30/2021. Patient had an uncomplicated labor course as follows:  Membrane Rupture Time/Date: 10:16 AM ,03/30/2021   Delivery Method:Vaginal, Spontaneous  Episiotomy: None  Lacerations:  None  Patient had an uncomplicated postpartum course.  She is ambulating, tolerating a regular diet, passing flatus, and urinating well. Patient is discharged home in stable condition on 03/31/21.  Newborn Data: Birth date:03/30/2021  Birth time:10:28 AM  Gender:Female  Living status:Living  Apgars:9 ,9  Weight:3470 g   Magnesium Sulfate received: No BMZ received: No Rhophylac:N/A MMR:N/A T-DaP:Given prenatally Flu: No Transfusion:No  Physical exam  Vitals:   03/30/21 1648 03/30/21 1914 03/30/21 2300 03/31/21 0804  BP: 104/78 105/73 111/80 90/63  Pulse: 72 75 94 73  Resp: 18 16 18 20  Temp: 98.4 F (36.9 C) 98.3 F (36.8 C)  98.3 F (36.8 C) 97.7 F (36.5 C)  TempSrc: Oral Oral Oral Oral  SpO2: 99% 100% 100% 100%  Weight:      Height:       General: alert, cooperative, and no distress Lochia: appropriate Uterine Fundus: firm Incision: N/A DVT Evaluation: No evidence of DVT seen on physical exam. No cords or calf tenderness. No significant calf/ankle edema. Labs: Lab Results  Component Value Date   WBC 15.9 (H) 03/31/2021   HGB 9.5 (L) 03/31/2021   HCT 28.2 (L) 03/31/2021   MCV 82.9 03/31/2021   PLT 294 03/31/2021   CMP Latest Ref Rng & Units 01/07/2021  Glucose 70 - 99 mg/dL 129(H)  BUN 6 - 20 mg/dL 5(L)  Creatinine 0.44 - 1.00 mg/dL 0.32(L)  Sodium 135 - 145 mmol/L 138  Potassium 3.5 - 5.1 mmol/L 3.3(L)  Chloride 98 - 111 mmol/L 106  CO2 22 - 32 mmol/L 21(L)  Calcium 8.9 - 10.3 mg/dL 8.6(L)  Total Protein 6.5 - 8.1 g/dL -  Total Bilirubin 0.3 - 1.2 mg/dL -  Alkaline Phos 38 - 126 U/L -  AST 15 - 41 U/L -  ALT 0 - 44 U/L -   Edinburgh Score: Edinburgh Postnatal Depression Scale Screening Tool 04/23/2018  I have been able to laugh and see the funny side of things. 0  I have looked forward with enjoyment to things. 0  I have blamed myself unnecessarily when things went wrong. 1  I have   been anxious or worried for no good reason. 1  I have felt scared or panicky for no good reason. 2  Things have been getting on top of me. 1  I have been so unhappy that I have had difficulty sleeping. 1  I have felt sad or miserable. 1  I have been so unhappy that I have been crying. 1  The thought of harming myself has occurred to me. 0  Edinburgh Postnatal Depression Scale Total 8      After visit meds:  Allergies as of 03/31/2021       Reactions   Tylenol [acetaminophen] Shortness Of Breath   Throat edema. Unusual sensations in nostrils. Headache.        Medication List     STOP taking these medications    Accu-Chek Nano SmartView w/Device Kit   Accu-Chek SmartView test strip Generic  drug: glucose blood   Accu-Chek Softclix Lancets lancets       TAKE these medications    ibuprofen 600 MG tablet Commonly known as: ADVIL Take 1 tablet (600 mg total) by mouth every 6 (six) hours as needed for mild pain or cramping.   multivitamin-prenatal 27-0.8 MG Tabs tablet Take 1 tablet by mouth daily at 12 noon.               Discharge Care Instructions  (From admission, onward)           Start     Ordered   03/31/21 0000  Discharge wound care:       Comments: Perform wound care instructions   03/31/21 1002             Discharge home in stable condition Infant Feeding: Breast Infant Disposition:home with mother Discharge instruction: per After Visit Summary and Postpartum booklet. Activity: Advance as tolerated. Pelvic rest for 6 weeks.  Diet: routine diet Anticipated Birth Control: Depo Postpartum Appointment:2 weeks and 6 weeks Additional Postpartum F/U: Postpartum Depression checkup Future Appointments:No future appointments. Follow up Visit:  [place] DEPT Follow up in 2 week(s).   Why: Please call the Health Department and make an apppointment for 2 weeks post delivery for mood check and again at 6 weeks post delivery with 2h gtt. Contact information: [address] [place] [address] [phone]                SIGNED:  Prentice Docker, MD, [facility], [facility] 03/31/2021 10:04 AM

## 2021-03-30 NOTE — H&P (Signed)
Brittany Vaughn is a 20 y.o. female presenting to Labor and delivery with birth imminent..She  has arrived to the unit in active labor with a desire to push. Her [facility], and is marked by: Gestational Diabetes, MJ use, missed prenatal visits, and depressive symptoms.  OB History     Gravida  2   Para  2   Term  2   Preterm  0   AB  0   Living  2      SAB  0   IAB  0   Ectopic  0   Multiple  0   Live Births  2          Past Medical History:  Diagnosis Date   Abnormal Pap smear of cervix LGSIL 09/04/20 09/06/2020   Anemia    during pregnancy   Depression    third trimester depression (situationaol)   Dyspnea    during allergic reaction to Tylenol   Gestational diabetes mellitus (GDM), antepartum 01/15/2021   Headache    history of migraines   History of depression    third trimester pregnancy (situational per client)   History of urinary tract infection    Marijuana use    Myopericarditis    a. 10/2017 in setting of pregnancy-->chest pain w/ trop of 4.91; b. 10/2017 Echo: EF 50-55%, no rwma.   Ruptured appendicitis 05/15/2018   Past Surgical History:  Procedure Laterality Date   LAPAROSCOPIC APPENDECTOMY N/A 05/16/2018   Procedure: APPENDECTOMY LAPAROSCOPIC;  Surgeon: Brittany Ree, MD;  Location: [facility] ORS;  Service: General;  Laterality: N/A;   Family History: family history includes Asthma in her brother, daughter, and father; Breast cancer in her maternal aunt; Heart disease in her maternal grandfather and paternal grandmother; Hypertension in her mother. Social History:  reports that she has quit smoking. Her smoking use included e-cigarettes and cigarettes. She has never used smokeless tobacco. She reports previous alcohol use of about 1.0 standard drink of alcohol per week. She reports previous drug use. Drug: Marijuana.     Maternal Diabetes: Yes:  Diabetes Type:  Diet controlled Genetic Screening:  Normal Maternal Ultrasounds/Referrals: Normal Fetal Ultrasounds or other Referrals:  Referred to Materal Fetal Medicine  Maternal Substance Abuse:  Yes:  Type: Marijuana Significant Maternal Medications:  None Significant Maternal Lab Results:  Group B Strep negative and Other:  pap smear- LGSIL Other Comments:  None  Review of Systems  Constitutional: Negative.   HENT: Negative.    Respiratory:         Tachypneic liikely due to advanced labor.  Cardiovascular: Negative.   Gastrointestinal:        Gravid abdomen  Endocrine: Negative.   Genitourinary:  Positive for vaginal bleeding and vaginal discharge.       "Bloody show"  Musculoskeletal: Negative.   Allergic/Immunologic: Negative.   Neurological: Negative.   Hematological: Negative.   Psychiatric/Behavioral: Negative.    History Dilation: 10 Station: Plus 1 Exam by:: Brittany Vaughn CNM Last menstrual period 06/12/2020, unknown if currently breastfeeding. Maternal Exam:  Uterine Assessment: Contraction strength is moderate.  Contraction frequency is regular.  Abdomen: Estimated fetal weight is 8lbs.   Fetal presentation: vertex Introitus: Normal vulva. Vagina is positive for vaginal discharge.  Amniotic fluid character: not assessed. Partially shaved mons- red "shaving?" Rash noted over mons. Pelvis: adequate for delivery.   Cervix: Cervix evaluated by digital exam.    Physical Exam Constitutional:      Appearance:  Normal appearance.  HENT:     Head: Normocephalic and atraumatic.  Cardiovascular:     Rate and Rhythm: Normal rate and regular rhythm.     Pulses: Normal pulses.     Heart sounds: Normal heart sounds.  Pulmonary:     Effort: Pulmonary effort is normal.     Breath sounds: Normal breath sounds.     Comments: Somewhat tachypneic during final stage of labor. Abdominal:     Comments: Gravid abdomen  Genitourinary:    General: Normal vulva.     Vagina: Vaginal discharge present.  Musculoskeletal:         General: Normal range of motion.     Cervical back: Normal range of motion and neck supple.  Skin:    General: Skin is warm and dry.  Neurological:     General: No focal deficit present.     Mental Status: She is alert and oriented to person, place, and time.  Psychiatric:        Mood and Affect: Mood normal.        Behavior: Behavior normal.    Prenatal labs: ABO, Rh: O/Positive/-- (01/10 1510) Antibody: Negative (01/10 1510) Rubella:   RPR: Non Reactive (05/12 1019)  HBsAg: Negative (01/10 1510)  HIV:    GBS: Negative/-- (07/12 1616)   Assessment/Plan:IUP 40 weeks 6 days Approaching second stage labor- cervical "rim" Reassuring FHTs per doppler. Higher risk due to GDM, less compliant with glucose management; marijuana use Admitted quickly. Delivery table set up, NBN notifed. Anticipate SVD.    Brittany Vaughn 03/30/2021, 11:01 AM

## 2021-03-31 ENCOUNTER — Encounter: Payer: Self-pay | Admitting: Obstetrics & Gynecology

## 2021-03-31 DIAGNOSIS — O24410 Gestational diabetes mellitus in pregnancy, diet controlled: Secondary | ICD-10-CM | POA: Diagnosis not present

## 2021-03-31 DIAGNOSIS — Z3A40 40 weeks gestation of pregnancy: Secondary | ICD-10-CM | POA: Diagnosis not present

## 2021-03-31 DIAGNOSIS — O480 Post-term pregnancy: Secondary | ICD-10-CM | POA: Diagnosis not present

## 2021-03-31 LAB — CBC
HCT: 28.2 % — ABNORMAL LOW (ref 36.0–46.0)
Hemoglobin: 9.5 g/dL — ABNORMAL LOW (ref 12.0–15.0)
MCH: 27.9 pg (ref 26.0–34.0)
MCHC: 33.7 g/dL (ref 30.0–36.0)
MCV: 82.9 fL (ref 80.0–100.0)
Platelets: 294 10*3/uL (ref 150–400)
RBC: 3.4 MIL/uL — ABNORMAL LOW (ref 3.87–5.11)
RDW: 14.2 % (ref 11.5–15.5)
WBC: 15.9 10*3/uL — ABNORMAL HIGH (ref 4.0–10.5)
nRBC: 0 % (ref 0.0–0.2)

## 2021-03-31 LAB — RPR: RPR Ser Ql: NONREACTIVE

## 2021-03-31 MED ORDER — IBUPROFEN 600 MG PO TABS: 600.0000 mg | ORAL_TABLET | Freq: Four times a day (QID) | ORAL | 0 refills | Status: DC | PRN

## 2021-03-31 MED ORDER — MEDROXYPROGESTERONE ACETATE 150 MG/ML IM SUSP
150.0000 mg | INTRAMUSCULAR | Status: AC | PRN
  Administered 2021-03-31: 150 mg via INTRAMUSCULAR
  Filled 2021-03-31 (×2): qty 1
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
## 2021-03-31 NOTE — Progress Notes (Signed)
Pt. discharged home in stable condition. Maternal and child discharge instructions and follow-up care reviewed-pt.verbalized understanding. Pt. discharged home in private vehicle with support person. Pt. stable at discharge.  Summary       - pelvic rest for 6 weeks after delivery (no tampons, douches, sexual intercourse) until cleared by provider.        -drink plenty of water       -call office with any questions

## 2021-04-17 ENCOUNTER — Ambulatory Visit: Payer: Medicaid Other

## 2021-05-15 ENCOUNTER — Telehealth: Payer: Self-pay

## 2021-05-15 NOTE — Telephone Encounter (Signed)
TC to patient to schedule PP appointment. Patient delivered on 03/30/2021. LM with number to call.Jenetta Downer, RN

## 2021-05-22 NOTE — Telephone Encounter (Signed)
TC to patient to schedule for PP appointment. Patient requested appointment this week, scheduled for 05/24/2021. States she received Depo in the hospital after birth of her baby and may want to discuss BCM options at her appointment.Jenetta Downer, RN

## 2021-05-24 ENCOUNTER — Other Ambulatory Visit: Payer: Self-pay

## 2021-05-24 ENCOUNTER — Encounter: Payer: Self-pay | Admitting: Family Medicine

## 2021-05-24 ENCOUNTER — Ambulatory Visit: Payer: Medicaid Other | Admitting: Family Medicine

## 2021-05-24 DIAGNOSIS — O24410 Gestational diabetes mellitus in pregnancy, diet controlled: Secondary | ICD-10-CM | POA: Diagnosis not present

## 2021-05-24 DIAGNOSIS — Z8659 Personal history of other mental and behavioral disorders: Secondary | ICD-10-CM

## 2021-05-24 LAB — HEMOGLOBIN, FINGERSTICK: Hemoglobin: 12.3 g/dL (ref 11.1–15.9)

## 2021-05-24 NOTE — Progress Notes (Signed)
Pt here for PP visit.  Normal Hgb results given.  Pt declined condoms.  2 hr gtt scheduled and pt informed to remain NPO at least 8 hours prior to testing. Windle Guard, RN

## 2021-05-24 NOTE — Progress Notes (Signed)
Post Partum Exam  Brittany Vaughn is a 20 y.o. G52P2002 female who presents for a postpartum visit. She is 7  weeks 6 days   postpartum following a spontaneous vaginal delivery. I have fully reviewed the prenatal and intrapartum course. The delivery was at 40w 6d gestational weeks.  Anesthesia: none. Postpartum course has been doing well . Baby's course has been adapting well, finally getting on a good sleeping schedule3.Randel Books is feeding by Bottle Bleeding  spotting . Bowel function is normal. Bladder function is normal. Patient is sexually active. Contraception method is Depo-Provera injections.   Postpartum depression screening:    The following portions of the patient's history were reviewed and updated as appropriate: allergies, current medications, past family history, past medical history, past social history, past surgical history, and problem list. Last pap smear done 09/04/2020 and was  LSIL   Review of Systems Pertinent items are noted in HPI.    Objective:  There were no vitals taken for this visit.  Gen: well appearing, NAD HEENT: no scleral icterus CV: RR Lung: Normal WOB Breast:performed-not indicated  Ext: warm well perfused  GU: no indicated  Rectal: performed -  not indicated       Assessment:     postpartum exam. Pap smear not done at today's visit.   Plan:   Essential components of care per ACOG recommendations for Comprehensive Postpartum exam:  1.  Mood and well being: Patient with negative depression screening today. Reviewed local resources for support. EPDS is low risk. Reviewed resources and that mood sx in first year after pregnancy are considered related to pregnancy and to reach out for help at [facility] if needed. Discussed [facility] as link to care and availability of LCSW for counseling  - Patient does not use tobacco.  - hx of drug use? No  last used 06/2020   2. Infant care and feeding:  -Patient currently breastmilk feeding? No I.   -Recommended  patient engage with [facility] peer counselors  -Counseled to sign new child up for [facility] services -Social determinants of health (SDOH) reviewed in EPIC. No concerns. The following needs were identified were none.    3. Sexuality, contraception and birth spacing  Contraception: Contraception counseling: Reviewed all forms of birth control options in the tiered based approach. available including abstinence; over the counter/barrier methods; hormonal contraceptive medication including pill, patch, ring, injection,contraceptive implant; hormonal and nonhormonal IUDs; permanent sterilization options including vasectomy and the various tubal sterilization modalities. Risks, benefits, and typical effectiveness rates were reviewed.  Questions were answered.  Written information was also given to the patient to review.  Patient desires not to continue Depo,partner is infertile.  She will follow up as needed and discussed abe to return in 6 weeks for continuation of depo if desired. She was told to call with any further questions, or with any concerns about this method of contraception.  Emphasized use of condoms 100% of the time for STI prevention.  Patient was offered ECP. ECP was not accepted by the patient. ECP counseling was not given - see RN documentation  - Patient does not want a pregnancy in the next year.  Desired family size is 3 children.  - Reviewed forms of contraception in tiered fashion. Patient desired  partner is infertile , no method  today.   - Discussed birth spacing of 18 months  4. Sleep and fatigue -Encouraged family/partner/community support of 4 hrs of uninterrupted sleep to help with mood and fatigue  5. Physical  Recovery  - Discussed patients delivery and complications - Patient had a 0 degree laceration, perineal healing reviewed. Patient expressed understanding - Patient has urinary incontinence? No - Patient is safe to resume physical and sexual activity  6.  Health  Maintenance/Chronic Disease Health Maintenance Due  Topic Date Due   COVID-19 Vaccine (1) Never done   URINE MICROALBUMIN  Never done   HPV VACCINES (1 - 2-dose series) Never done   INFLUENZA VACCINE  03/26/2021    - Last pap smear performed 09/04/2020 and was  LSIL  without cotesting for HPV.  Due for repeat pap 08/2021 N/I for Mammogram  1. Postpartum exam Discussed importance of adapting and sleep Encouraged to continue to take PNV  Informed pap due 08/2021    2. Diet controlled gestational diabetes mellitus (GDM), antepartum Pt needs 2 hr gtt scheduled, patient informed to fast min 8 hours prior to testing.     3. History of depression Denies any s/sx of depression, reports "I have been feeling well and enjoying this time.  Discussed with patient how to access [facility] for appointment if needed or if starts to feel down depressed or hopeless.    Patient given handout about PCP care in the community Given MVI per family planning program guidelines and availability  Follow up in: 1  year  or as needed.

## 2021-05-30 ENCOUNTER — Other Ambulatory Visit: Payer: Medicaid Other

## 2021-08-30 NOTE — Progress Notes (Signed)
PAP letter mailed today.  PAP/PE due January 2023. Dahlia Bailiff, RN

## 2021-11-06 ENCOUNTER — Encounter: Payer: Self-pay | Admitting: Advanced Practice Midwife

## 2021-12-05 ENCOUNTER — Ambulatory Visit (LOCAL_COMMUNITY_HEALTH_CENTER): Payer: Medicaid Other | Admitting: Family Medicine

## 2021-12-05 VITALS — BP 113/77 | Ht 64.0 in | Wt 186.4 lb

## 2021-12-05 DIAGNOSIS — Z30013 Encounter for initial prescription of injectable contraceptive: Secondary | ICD-10-CM

## 2021-12-05 DIAGNOSIS — Z3009 Encounter for other general counseling and advice on contraception: Secondary | ICD-10-CM | POA: Diagnosis not present

## 2021-12-05 DIAGNOSIS — Z124 Encounter for screening for malignant neoplasm of cervix: Secondary | ICD-10-CM

## 2021-12-05 DIAGNOSIS — Z3202 Encounter for pregnancy test, result negative: Secondary | ICD-10-CM | POA: Diagnosis not present

## 2021-12-05 DIAGNOSIS — Z3200 Encounter for pregnancy test, result unknown: Secondary | ICD-10-CM

## 2021-12-05 LAB — PREGNANCY, URINE: Preg Test, Ur: NEGATIVE

## 2021-12-05 MED ORDER — MEDROXYPROGESTERONE ACETATE 150 MG/ML IM SUSP
150.0000 mg | INTRAMUSCULAR | Status: AC
  Administered 2021-12-05 – 2022-02-27 (×2): 150 mg via INTRAMUSCULAR

## 2021-12-05 NOTE — Progress Notes (Signed)
Patient here for pap smear and depo injection. Providers orders completed. Depo given in L deltoid. Appt reminder card given to patient. Patient to come in 02/20/22.  ?

## 2021-12-05 NOTE — Progress Notes (Signed)
? ?[place] problem visit  ?[place] Department ? ?Subjective:  ?Brittany Vaughn is a 21 y.o. being seen today for  ? ?Chief Complaint  ?Patient presents with  ? Abnormal Pap Smear  ?  Contraception ?  ? ? ?Pt in clinic for Pap and start Depo  ?Last pap was 0/10/22 and was LSIL  HPV (-) ? ? ? ?Does the patient have a current or past history of drug use? Yes  ? No components found for: HCV] ? ? ?Health Maintenance Due  ?Topic Date Due  ? COVID-19 Vaccine (1) Never done  ? URINE MICROALBUMIN  Never done  ? HPV VACCINES (2 - 3-dose series) 08/12/2014  ? PAP SMEAR-Modifier  09/04/2021  ? ? ?Review of Systems  ?Constitutional:  Negative for chills, fever, malaise/fatigue and weight loss.  ?HENT:  Negative for congestion, hearing loss and sore throat.   ?Eyes:  Negative for blurred vision, double vision and photophobia.  ?Respiratory:  Negative for shortness of breath.   ?Cardiovascular:  Negative for chest pain.  ?Gastrointestinal:  Negative for abdominal pain, blood in stool, constipation, diarrhea, heartburn, nausea and vomiting.  ?Genitourinary:  Negative for dysuria and frequency.  ?Musculoskeletal:  Negative for back pain, joint pain and neck pain.  ?Skin:  Negative for itching and rash.  ?Neurological:  Negative for dizziness, weakness and headaches.  ?Endo/Heme/Allergies:  Does not bruise/bleed easily.  ?Psychiatric/Behavioral:  Negative for depression, substance abuse and suicidal ideas.   ? ?The following portions of the patient's history were reviewed and updated as appropriate: allergies, current medications, past family history, past medical history, past social history, past surgical history and problem list. Problem list updated. ? ? ?See flowsheet for other program required questions. ? ?Objective:  ? ?Vitals:  ? 12/05/21 0932  ?BP: 113/77  ?Weight: 186 lb 6.4 oz (84.6 kg)  ?Height: '5\' 4"'$  (1.626 m)  ? ? ?Physical Exam ?Constitutional:   ?   Appearance: Normal appearance.   ?HENT:  ?   Head: Normocephalic and atraumatic.  ?Pulmonary:  ?   Effort: Pulmonary effort is normal.  ?Genitourinary: ?   General: Normal vulva.  ?   Rectum: Normal.  ?   Comments: External genitalia without, lice, nits, erythema, edema , lesions or inguinal adenopathy. Vagina with normal mucosa and discharge and pH equals 4.  Cervix without visual lesions, uterus firm, mobile, non-tender, no masses, CMT adnexal fullness or tenderness.  ? ?Skin: ?   General: Skin is warm and dry.  ?Neurological:  ?   General: No focal deficit present.  ?   Mental Status: She is alert and oriented to person, place, and time.  ?Psychiatric:     ?   Mood and Affect: Mood normal.     ?   Behavior: Behavior normal.  ? ? ? ? ?Assessment and Plan:  ?Brittany Vaughn is a 21 y.o. female presenting to the [facility] Department for a Women's Health problem visit ? ?1. Encounter for pregnancy test, result unknown ?- Pregnancy, urine ? ?2. Encounter for Pap cervical smear following prior abnormal smear ?No PE, pt had PP visit 05/24/2021.  ?Pt has history of abnormal pap- 09/04/2020 LSIL  ?- IGP, rfx Aptima HPV ASCU ? ?3. Encounter for initial prescription of injectable contraceptive ?Pt desires depo for BCM,  ?Pt las sex was 1 day ago with a condom.  Pt declined ECP, asked to take home PT in 2 weeks and reports to clinic if positive.  ?Ok  for DMPA 150 mg IM every 11-13 weeks x 1 year.  ? ?- medroxyPROGESTERone (DEPO-PROVERA) injection 150 mg ? ?No follow-ups on file. ? ?No future appointments. ? ?Junious Dresser, FNP ? ?

## 2021-12-15 LAB — IGP, RFX APTIMA HPV ASCU: PAP Smear Comment: 0

## 2022-01-18 ENCOUNTER — Encounter: Payer: Self-pay | Admitting: Family Medicine

## 2022-01-18 ENCOUNTER — Telehealth: Payer: Self-pay | Admitting: Family Medicine

## 2022-01-18 NOTE — Telephone Encounter (Signed)
Call to patient to discuss need for Colpo follow up instead of waiting 1 year to do another pap smear.  Patient verbalized understanding.  Patient informed she would be contacted by pap nurse or clinic that she is referred to for [place].    Pt questions answered.   Junious Dresser, FNP

## 2022-01-18 NOTE — Progress Notes (Unsigned)
Pap smear was incorrectly triaged   Based on ASC-H patient needs colposcopy.   Dahlia Bailiff, PAP RN aware and patient was called by originally triaging provider Junious Dresser.

## 2022-01-22 ENCOUNTER — Telehealth: Payer: Self-pay

## 2022-01-22 NOTE — Telephone Encounter (Signed)
Pap smear was incorrectly triaged    Based on ASC-H patient needs colposcopy.    Brittany Vaughn, PAP RN aware and patient was called by originally triaging provider Junious Dresser. (copied and pasted note from Dr. Ernestina Patches 01-22-2022).  Telephone call to patient this morning regarding her abnormal PAP 12-05-2021 (ASCUS cannot r/o ASC-H) and HPV not done. Phone states "this phone has a voicemail that has not been setup".  No message left.  MyChart message sent to patient today asking her to return my call at [phone] about her Colpo referral.  Las[place] patient login 01-01-2022. Brittany Bailiff, RN

## 2022-01-24 NOTE — Telephone Encounter (Signed)
Telephone call to patient this morning regarding the need for a Colpo referral for ASCUS cannot r/o ASC-H.  Patient thinks she has Medicaid (verified with Maggie C. and patient does have [place], which is regular Medicaid).  Patient desires her Colpo referral to [facility].  [facility] referral completed today. Dahlia Bailiff, RN

## 2022-02-12 ENCOUNTER — Ambulatory Visit: Payer: Medicaid Other | Admitting: Obstetrics

## 2022-02-19 ENCOUNTER — Telehealth: Payer: Self-pay | Admitting: Obstetrics

## 2022-02-19 ENCOUNTER — Ambulatory Visit: Payer: Medicaid Other | Admitting: Obstetrics

## 2022-02-20 ENCOUNTER — Encounter: Payer: Self-pay | Admitting: Obstetrics

## 2022-02-20 NOTE — Telephone Encounter (Signed)
Contacted pt to reschedule colpo appointment with Dr. Sharlet Salina on 6/27 (x2).  Did not reach pt and could not leave a message bc voice mailbox was not set up.  Will send a MyChart message to pt.

## 2022-02-27 ENCOUNTER — Ambulatory Visit (LOCAL_COMMUNITY_HEALTH_CENTER): Payer: Medicaid Other

## 2022-02-27 VITALS — BP 115/81 | Ht 64.0 in | Wt 185.5 lb

## 2022-02-27 DIAGNOSIS — Z309 Encounter for contraceptive management, unspecified: Secondary | ICD-10-CM | POA: Diagnosis not present

## 2022-02-27 DIAGNOSIS — Z3009 Encounter for other general counseling and advice on contraception: Secondary | ICD-10-CM

## 2022-02-27 DIAGNOSIS — Z30013 Encounter for initial prescription of injectable contraceptive: Secondary | ICD-10-CM

## 2022-02-27 NOTE — Progress Notes (Signed)
12 weeks 0 days post depo. Voices no concerns. Pt missed recent colpo appt at [facility]. Per B Amash, RN (pap f-u), pt advised to contact [facility] to reschedule colpo [phone]). RN explained this to pt and she is in agreement and plans to contact [facility].   Depo given today per order by Hilario Quarry, FNP dated 12/05/2021. Tolerated well L Delt. Next depo due 05/15/2022, has reminder. Josie Saunders, RN

## 2022-04-03 NOTE — Progress Notes (Signed)
Abnormal PAP 12-05-2021 ASCUS cannot r/o ASC-H and no HPV.  Colpo referral sent to [facility].  Appointment scheduled for 02-12-2022 and canceled, but rescheduled for 02-19-2022.  Patient no showed 02-19-2022 appointment.  [facility] has attempted to get patient rescheduled with no success.  MyChart message sent to patient today and also a Certified Letter mailed regarding her abnormal PAP and asking patient to call [facility] as soon as possible to reschedule.  It also asked her to view her MyChart message sent today.  [place].  Dahlia Bailiff, RN

## 2022-06-07 ENCOUNTER — Ambulatory Visit: Payer: Medicaid Other

## 2022-11-03 IMAGING — CT CT ANGIO CHEST
2 of 6 series · 18 of 46 positions shown · IV contrast (APPLIED)
Comparison: None.

CLINICAL DATA: Centralized chest pain and shortness of breath.
Patient is currently pregnant.

EXAM:
CT ANGIOGRAPHY CHEST WITH CONTRAST
TECHNIQUE: Multidetector CT imaging of the chest was performed using the
standard protocol during bolus administration of intravenous
contrast. Multiplanar CT image reconstructions and MIPs were
obtained to evaluate the vascular anatomy.
CONTRAST:  75mL OMNIPAQUE IOHEXOL 350 MG/ML SOLN

[Series 5: thins · axial · 0.61mm/px · z∈[+128,+312]mm · 15 of 202 slices shown]
[im 9/202  lung]
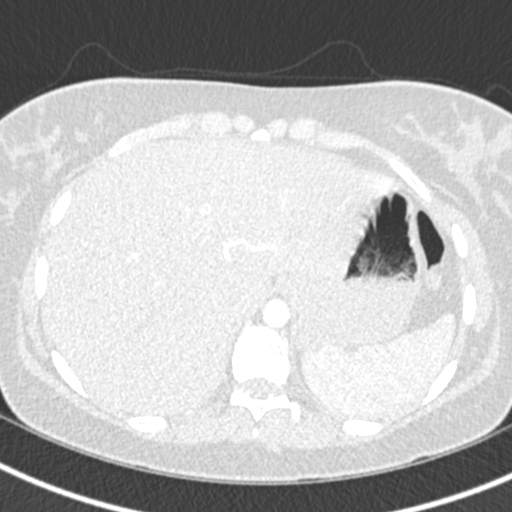
[im 27/202  soft-tissue]
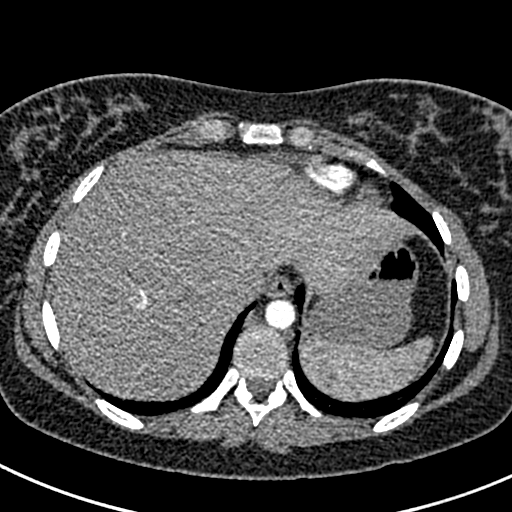
[im 35/202  lung]
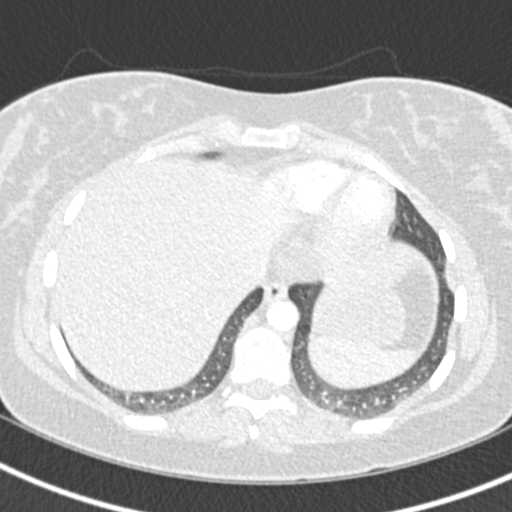
[im 53/202  soft-tissue]
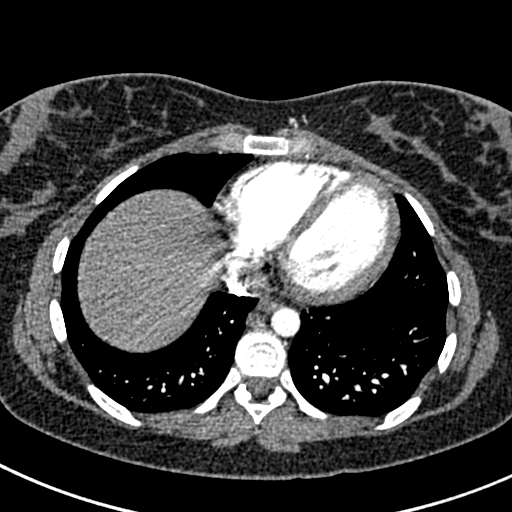
[im 62/202  lung]
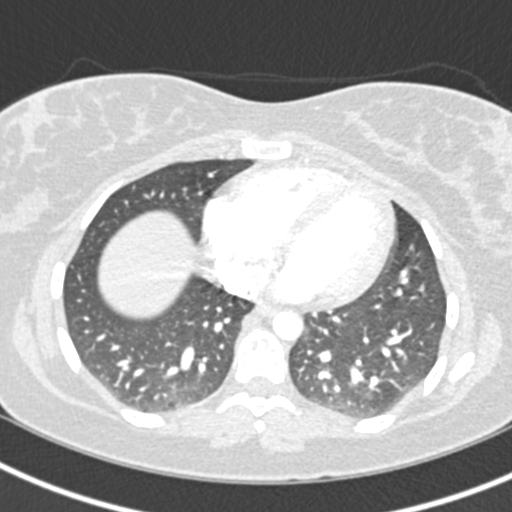
[im 79/202  soft-tissue]
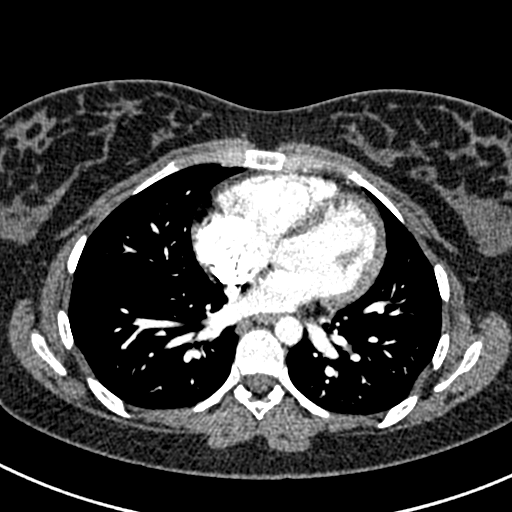
[im 88/202  lung]
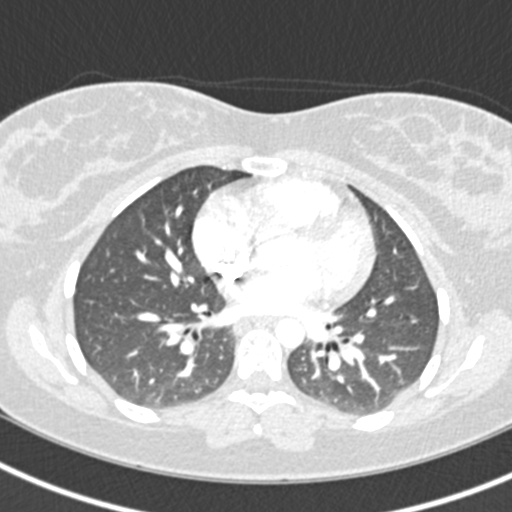
[im 105/202  soft-tissue]
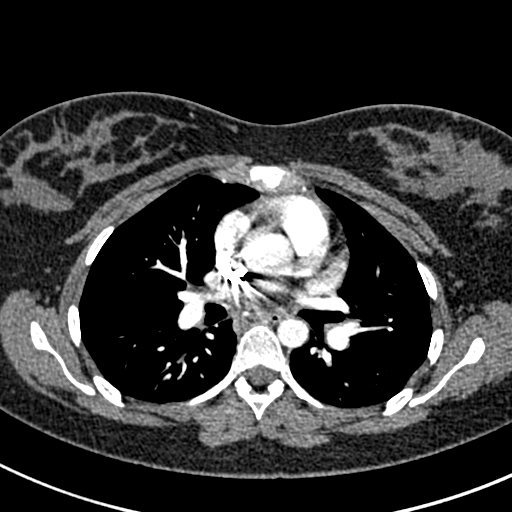
[im 114/202  lung]
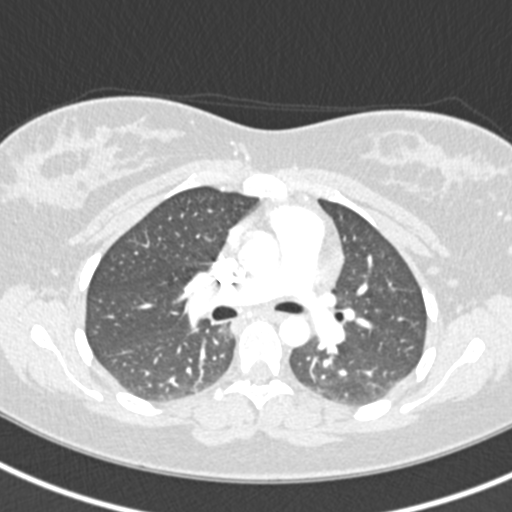
[im 123/202  soft-tissue]
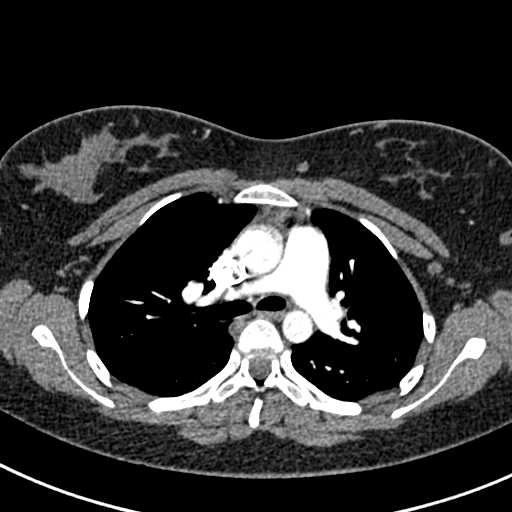
[im 140/202  lung]
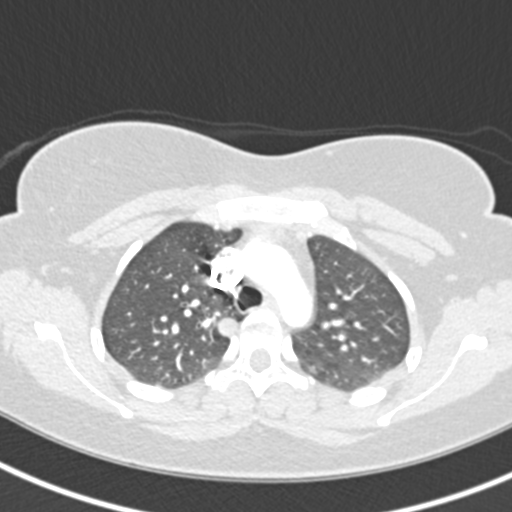
[im 149/202  soft-tissue]
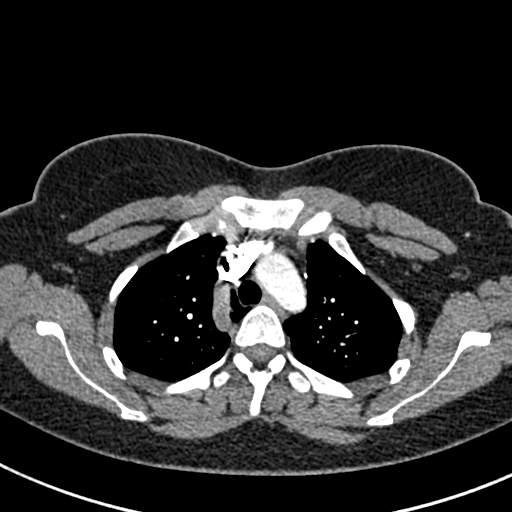
[im 167/202  lung]
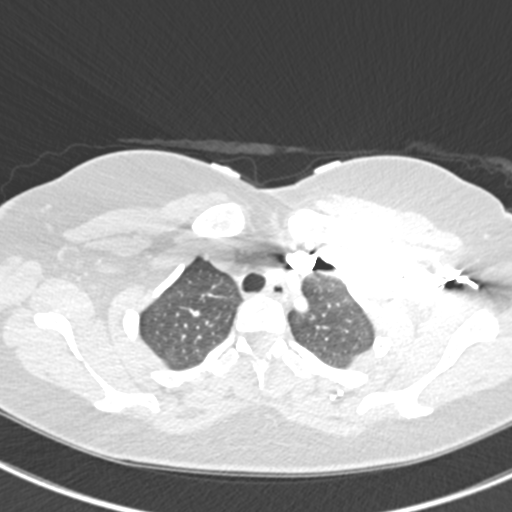
[im 175/202  soft-tissue]
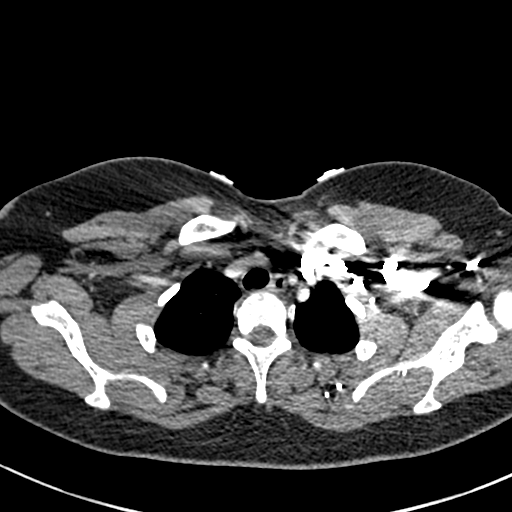
[im 193/202  lung]
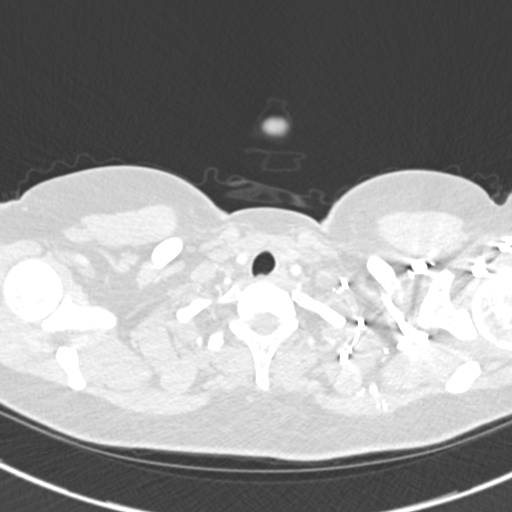

[Series 7: coronal mpr · coronal · 0.42mm/px · 3 of 79 slices shown]
[im 20/79  soft-tissue]
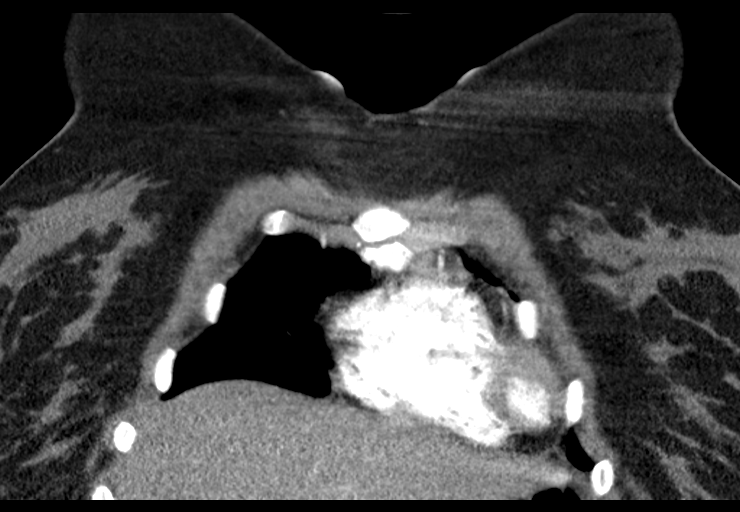
[im 40/79  soft-tissue]
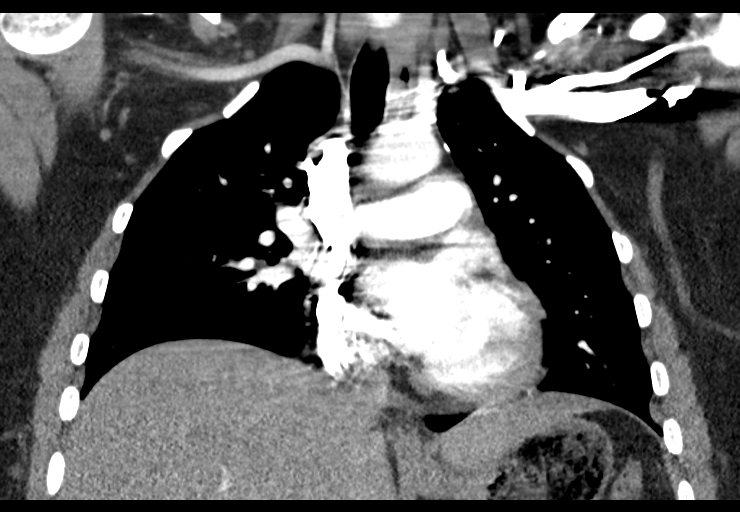
[im 59/79  soft-tissue]
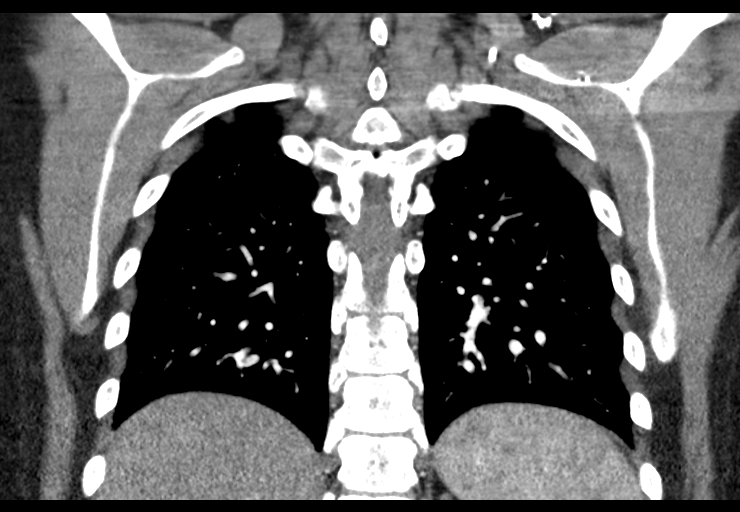

[18 of 46 positions shown; findings below may reference images not displayed]

FINDINGS: Cardiovascular: Satisfactory opacification of the pulmonary arteries
to the segmental level. No evidence of pulmonary embolism. Normal
heart size. No pericardial effusion.

Mediastinum/Nodes: No enlarged mediastinal, hilar, or axillary lymph
nodes. Thyroid gland, trachea, and esophagus demonstrate no
significant findings.

Lungs/Pleura: Lungs are clear. No pleural effusion or pneumothorax.

Upper Abdomen: No acute abnormality.

Musculoskeletal: No chest wall abnormality. No acute or significant
osseous findings.

Review of the MIP images confirms the above findings.
IMPRESSION: No evidence of pulmonary embolism or other acute intrathoracic
process.

## 2022-11-03 IMAGING — CR DG CHEST 2V
1 series · 2 of 2 positions shown · non-contrast
Comparison: 11/15/2017

CLINICAL DATA: Chest pain and tachycardia

EXAM:
CHEST - 2 VIEW

[Series 1: dg chest 2 view · 0.14mm/px · 2 of 2 slices shown]
[im 1/2]
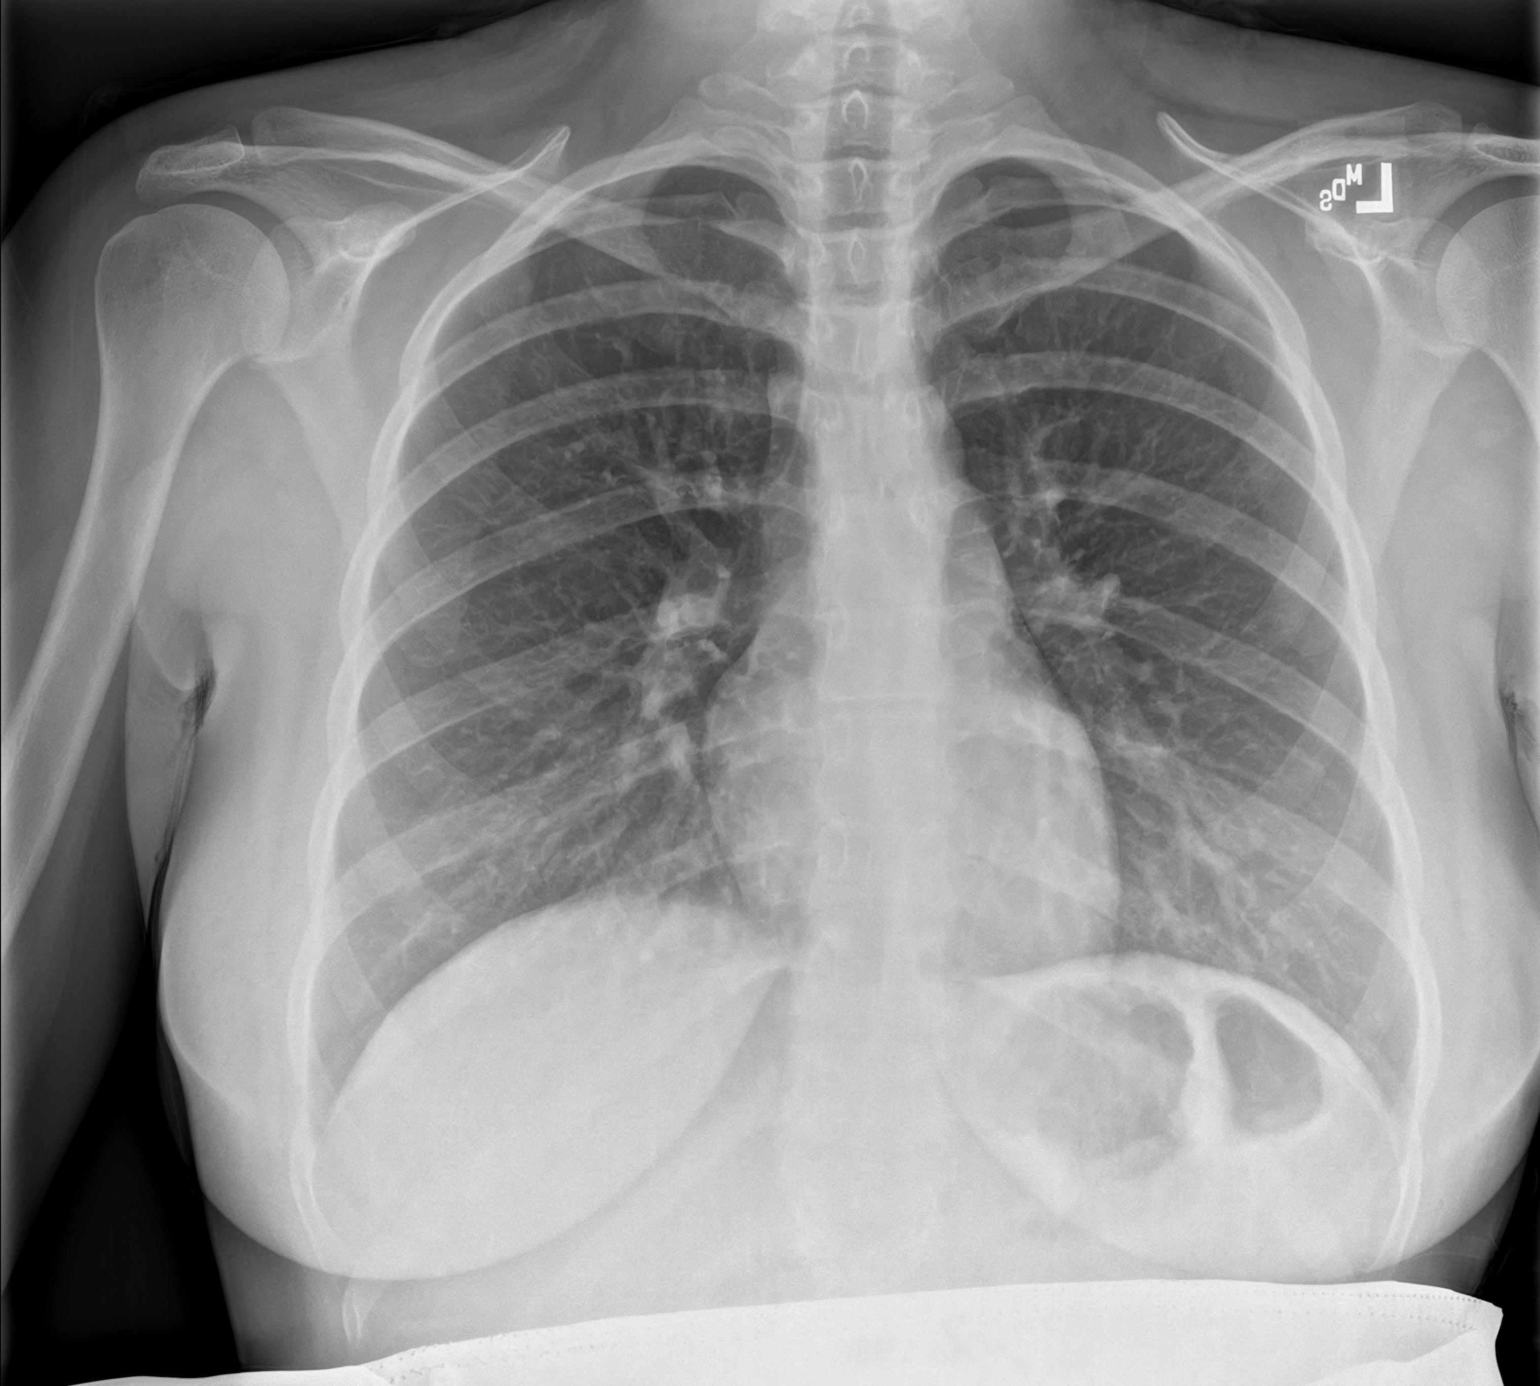
[im 2/2]
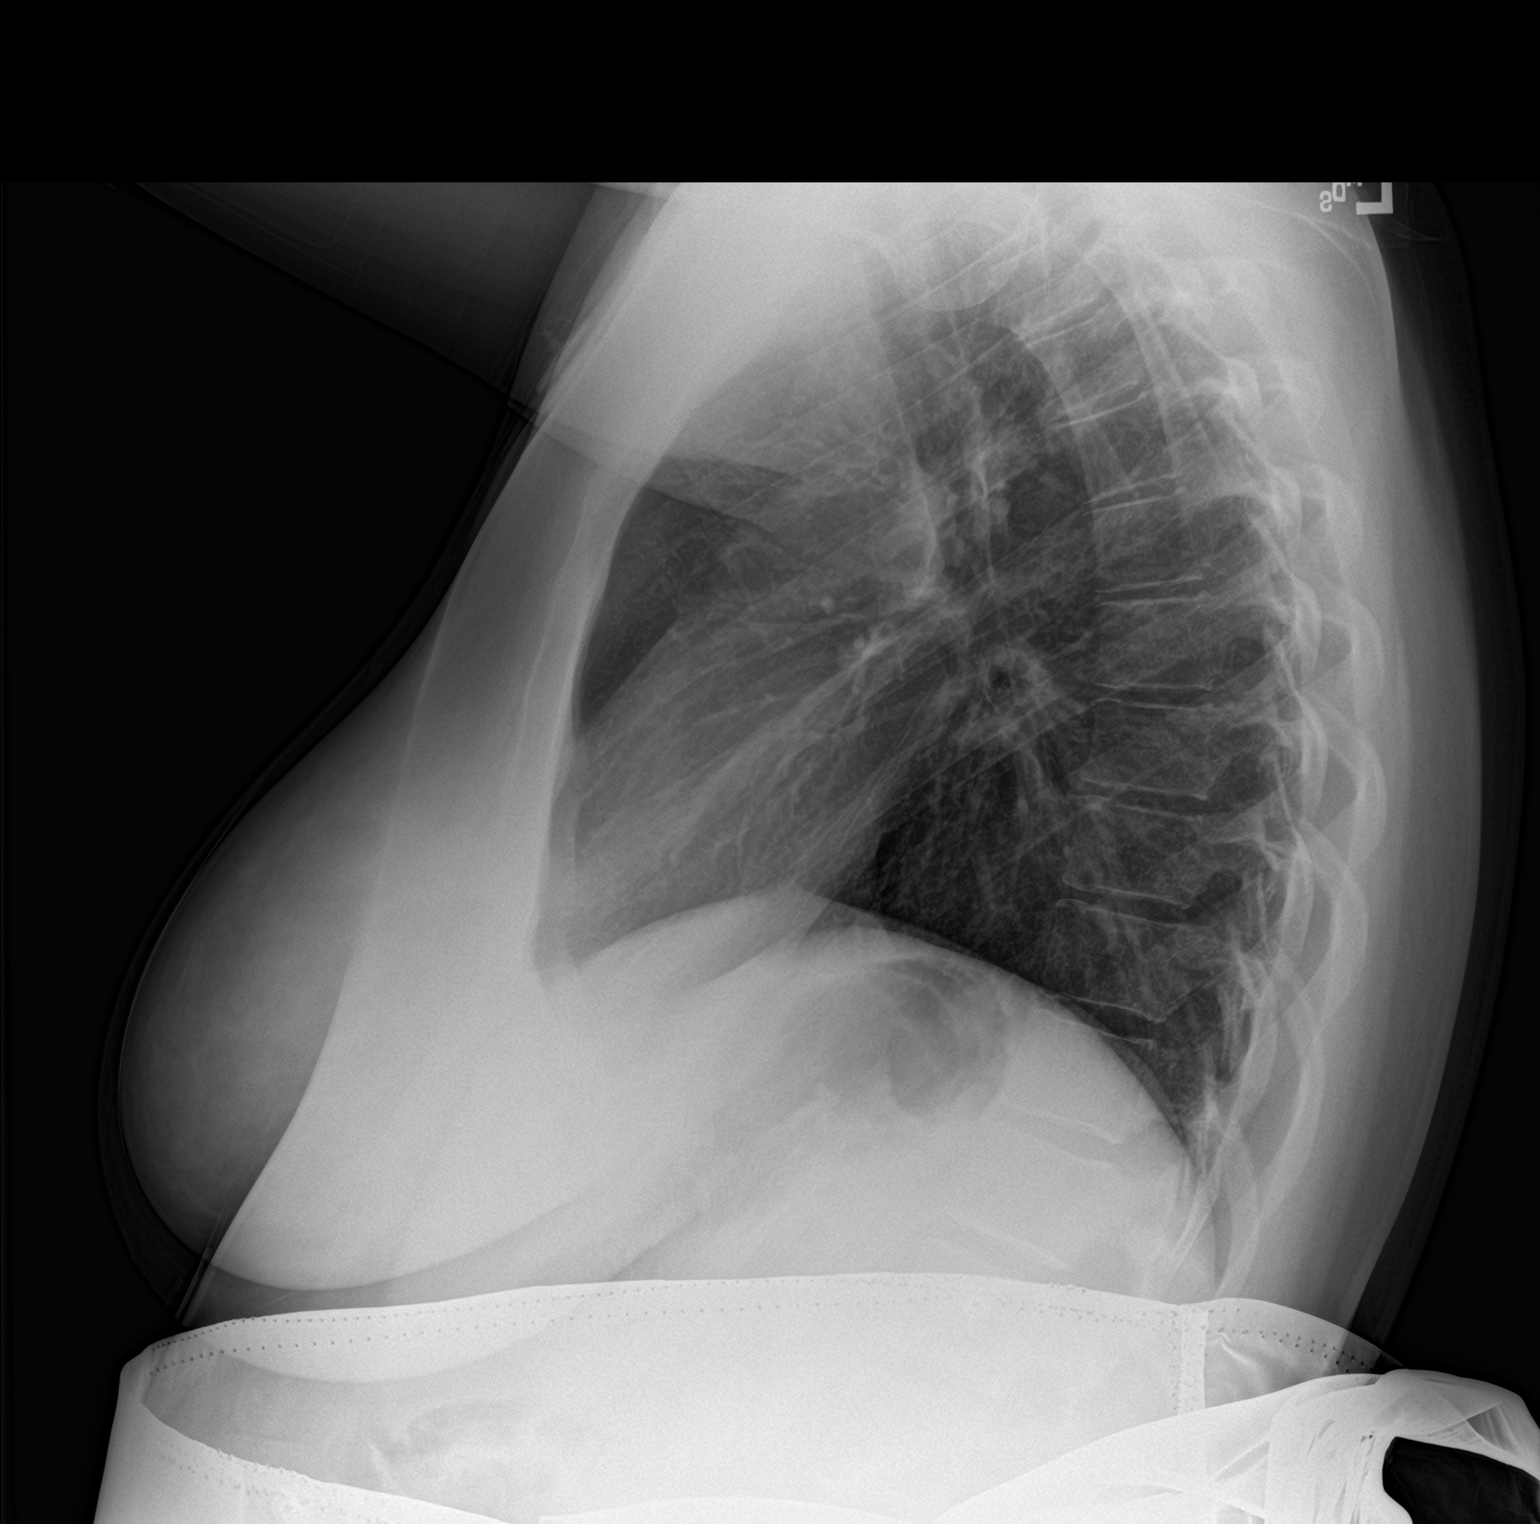

[2 of 2 positions shown; findings below may reference images not displayed]

FINDINGS: The heart size and mediastinal contours are within normal limits.
Both lungs are clear. The visualized skeletal structures are
unremarkable.
IMPRESSION: No active cardiopulmonary disease.

## 2022-11-11 ENCOUNTER — Encounter: Payer: Self-pay | Admitting: Family

## 2022-11-11 ENCOUNTER — Ambulatory Visit (LOCAL_COMMUNITY_HEALTH_CENTER): Payer: Medicaid Other | Admitting: Family

## 2022-11-11 VITALS — BP 100/71 | HR 94 | Ht 64.0 in | Wt 184.0 lb

## 2022-11-11 DIAGNOSIS — Z309 Encounter for contraceptive management, unspecified: Secondary | ICD-10-CM | POA: Diagnosis not present

## 2022-11-11 DIAGNOSIS — Z30013 Encounter for initial prescription of injectable contraceptive: Secondary | ICD-10-CM

## 2022-11-11 DIAGNOSIS — Z0142 Encounter for cervical smear to confirm findings of recent normal smear following initial abnormal smear: Secondary | ICD-10-CM

## 2022-11-11 DIAGNOSIS — Z01419 Encounter for gynecological examination (general) (routine) without abnormal findings: Secondary | ICD-10-CM

## 2022-11-11 MED ORDER — MEDROXYPROGESTERONE ACETATE 150 MG/ML IM SUSP
150.0000 mg | INTRAMUSCULAR | Status: DC
Start: 2022-11-11 — End: 2024-01-01
  Administered 2022-11-11 – 2023-01-27 (×2): 150 mg via INTRAMUSCULAR
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
## 2022-11-11 NOTE — Progress Notes (Unsigned)
Pt is here for PE, Pap and Depo. Depo 150 mg given IM in Lt deltoid.  Pt given reminder card to return in 11-13 weeks for next Depo injection. Windle Guard, RN

## 2022-11-16 LAB — SPECIMEN STATUS REPORT

## 2022-11-16 LAB — GYN REPORT: PAP Smear Comment: 0

## 2022-11-28 LAB — IGP, RFX APTIMA HPV ASCU: PAP Smear Comment: 0

## 2022-12-05 NOTE — Progress Notes (Signed)
Pap smear collected on 11/11/2022 with result of LSIL. Initial Pap. Repeat cytology in 1 year.  Colvin Caroli, FNP-C

## 2023-01-27 ENCOUNTER — Ambulatory Visit: Payer: Medicaid Other

## 2023-01-27 ENCOUNTER — Ambulatory Visit (LOCAL_COMMUNITY_HEALTH_CENTER): Payer: Medicaid Other

## 2023-01-27 VITALS — BP 116/84 | Ht 64.0 in | Wt 186.5 lb

## 2023-01-27 DIAGNOSIS — Z309 Encounter for contraceptive management, unspecified: Secondary | ICD-10-CM | POA: Diagnosis not present

## 2023-01-27 DIAGNOSIS — Z30013 Encounter for initial prescription of injectable contraceptive: Secondary | ICD-10-CM

## 2023-01-27 DIAGNOSIS — Z3009 Encounter for other general counseling and advice on contraception: Secondary | ICD-10-CM

## 2023-01-27 DIAGNOSIS — Z3042 Encounter for surveillance of injectable contraceptive: Secondary | ICD-10-CM

## 2023-01-27 NOTE — Progress Notes (Signed)
11 weeks post depo.  See Flowsheet.  Denies problems.  Depo given IM right deltoid per order by K. House FNP dated 11/11/22; tolerated well.  Next depo due 04/14/23; pt put reminder in her phone.  Cherlynn Polo, RN

## 2023-12-04 ENCOUNTER — Ambulatory Visit: Admitting: Family Medicine

## 2023-12-04 ENCOUNTER — Encounter: Payer: Self-pay | Admitting: Nurse Practitioner

## 2023-12-04 VITALS — BP 113/77 | HR 99 | Ht 66.0 in | Wt 188.6 lb

## 2023-12-04 DIAGNOSIS — Z124 Encounter for screening for malignant neoplasm of cervix: Secondary | ICD-10-CM | POA: Insufficient documentation

## 2023-12-04 DIAGNOSIS — Z309 Encounter for contraceptive management, unspecified: Secondary | ICD-10-CM | POA: Diagnosis not present

## 2023-12-04 DIAGNOSIS — Z3202 Encounter for pregnancy test, result negative: Secondary | ICD-10-CM | POA: Diagnosis not present

## 2023-12-04 DIAGNOSIS — Z113 Encounter for screening for infections with a predominantly sexual mode of transmission: Secondary | ICD-10-CM

## 2023-12-04 LAB — WET PREP FOR TRICH, YEAST, CLUE
Trichomonas Exam: NEGATIVE
Yeast Exam: NEGATIVE

## 2023-12-04 LAB — PREGNANCY, URINE: Preg Test, Ur: NEGATIVE

## 2023-12-04 NOTE — Progress Notes (Signed)
Pt is here for family planning visit.  Family planning packet reviewed and given to pt.  Wet prep results reviewed, no treatment required per standing orders. Condoms given.  Gaspar Garbe, RN
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
## 2023-12-04 NOTE — Patient Instructions (Signed)
 PAP Smear Today we performed a PAP smear to screen for cervical cancer. The results should be back in 1-2 weeks.  Once we have the results we can determine when your next screening should be.    Irregular bleeding I have referred you to [facility] here in [place] to help evaluate your irregular bleeding and possibly treat you without using birth control pills.   You should hear from them in 1-2 weeks. If you don't, please call them directly to follow up.   STI screening - Today we obtained a vaginal swab to screen for gonorrhea, chlamydia, and trichomonas - We also obtained a blood sample to screen for HIV and syphilis - If the results are abnormal, I will give you a call.    Estimated time frame for results collected at the [facility] Department: Same day Trichomonas Yeast BV (bacterial vaginosis)  Within 1-2 weeks Gonorrhea Chlamydia  Within 2-3 weeks HIV Syphilis Hepatitis B Hepatitis C

## 2023-12-10 LAB — GONOCOCCUS CULTURE

## 2023-12-18 ENCOUNTER — Encounter: Payer: Self-pay | Admitting: Family Medicine

## 2023-12-18 LAB — IGP, RFX APTIMA HPV ASCU: PAP Smear Comment: 0

## 2023-12-19 ENCOUNTER — Telehealth: Payer: Self-pay

## 2023-12-19 NOTE — Telephone Encounter (Signed)
 Call pt re pap results from 12/04/23 specimen and colpo referral. Atypical, cannot exclude high grade squamous intraep lesion; No HPV  Confirm insurance and where pt would like to go for colpo.

## 2023-12-19 NOTE — Telephone Encounter (Signed)
 Phone call to pt at [phone]. Pt answered and confirmed password from last visit. Pt counseled re pap results and colpo referral.  States she already has an appt with Billy Bue on 01/01/24, not related to pap/colpo. Requests that colpo referral be sent to same practice, [facility].  Confirms she has Medicaid.

## 2023-12-19 NOTE — Telephone Encounter (Signed)
 12/19/23 Colpo referral sent to [facility] through Epic's Referrals tab.

## 2024-01-01 ENCOUNTER — Ambulatory Visit: Admitting: Obstetrics and Gynecology

## 2024-01-01 ENCOUNTER — Encounter: Payer: Self-pay | Admitting: Obstetrics and Gynecology

## 2024-01-01 VITALS — BP 97/75 | HR 73 | Ht 66.0 in | Wt 185.5 lb

## 2024-01-01 DIAGNOSIS — Z7689 Persons encountering health services in other specified circumstances: Secondary | ICD-10-CM

## 2024-01-01 DIAGNOSIS — N946 Dysmenorrhea, unspecified: Secondary | ICD-10-CM

## 2024-01-01 DIAGNOSIS — N921 Excessive and frequent menstruation with irregular cycle: Secondary | ICD-10-CM

## 2024-01-01 DIAGNOSIS — N926 Irregular menstruation, unspecified: Secondary | ICD-10-CM

## 2024-01-01 MED ORDER — DESOGESTREL-ETHINYL ESTRADIOL 0.15-0.02/0.01 MG (21/5) PO TABS: 1.0000 | ORAL_TABLET | Freq: Every day | ORAL | 2 refills | Status: DC

## 2024-01-01 NOTE — Progress Notes (Signed)
 Patient presents today to discuss history of irregular menstrual cycles. Previously has used Depo with success but she does not want to use it anymore. Currently has irregular cycles varying from 1-14 days with moderate pain and bleeding. States she may also want to become pregnant within the next year.
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
## 2024-02-04 NOTE — Progress Notes (Signed)
 Referring Provider:  [facility]   HPI:  Brittany Vaughn is a 23 y.o.  [phone]  who presents today for evaluation and management of abnormal cervical cytology.    Prior pap smears:  Date:12/04/23 Pap:ASC-H  HPV: not tested  Date:11/14/22 WJX:BJYNW   HPV:not tested Date:12/05/21 Pap:ASC-H  HPV:not tested (Colpo referral sent, pt did not follow up) Date:09/04/20 GNF:AOZH MILD DYSPLASIA/CIN1 ENCOMPASSING HUMAN PAPILLOMAVIRUS  Prior cervical / vaginal findings: N/A  Prior cervical treatment(s): N/A  Symptoms/History:  -Abnormal vaginal discharge: no -Postmenopausal: no -Intermenstrual bleeding: no -Postcoital bleeding: no -Bleeding problems (non-gyn): no -Contraception: Pill -Number of current sexual partners: 1 -Number of partners in lifetime: 6 -History of a high risk partner: no -History of STDs: no -Smoking: vape -Gardasil Vaccine: yes       ROS:  Pertinent items are noted in HPI.  OB History  Gravida Para Term Preterm AB Living  2 2 2  0 0 2  SAB IAB Ectopic Multiple Live Births  0 0 0 0 2    # Outcome Date GA Lbr Len/2nd Weight Sex Type Anes PTL Lv  2 Term 03/30/21 [redacted]w[redacted]d 06:18 / 00:10 7 lb 10.4 oz (3.47 kg) F Vag-Spont None  LIV  1 Term 04/03/18 [redacted]w[redacted]d  8 lb 7 oz (3.827 kg) F Vag-Spont   LIV     Birth Comments: [facility], [facility]    Past Medical History:  Diagnosis Date   Abnormal Pap smear of cervix LGSIL 09/04/20 09/06/2020   Acute idiopathic pericarditis 10/2017 in pregnancy    Anemia    during pregnancy   Depression    third trimester depression (situationaol)   Dyspnea    during allergic reaction to Tylenol    Gestational diabetes mellitus (GDM), 01/11/21 01/15/2021   Current Diabetic Medications:  None   [x] ordered meter, strips, lancets- Date: KN ordered on 5/23  Diabetic Teaching for A1GDM or A2GDM: [x]  Lifestyles at [facility]- placed on 5/23: apts 01/19/21 & 02/16/21  [ ]  [facility] [ ]  WIC MNT   Baseline and surveillance labs (pulled in from [facility],  refresh links as needed)  Lab Results Component Value Date  CREATININE 0.32 (L) 01/07/2021  AST 15 05/20/2018  ALT   Gestational diabetes mellitus (GDM), antepartum 01/15/2021   Headache    history of migraines   History of depression    third trimester pregnancy (situational per client)   History of urinary tract infection    Marijuana use    Myopericarditis    a. 10/2017 in setting of pregnancy-->chest pain w/ trop of 4.91; b. 10/2017 Echo: EF 50-55%, no rwma.   Ruptured appendicitis 05/15/2018    Past Surgical History:  Procedure Laterality Date   LAPAROSCOPIC APPENDECTOMY N/A 05/16/2018   Procedure: APPENDECTOMY LAPAROSCOPIC;  Surgeon: Emmalene Hare, MD;  Location: [facility] ORS;  Service: General;  Laterality: N/A;    SOCIAL HISTORY:  Social History   Substance and Sexual Activity  Alcohol Use Not Currently   Alcohol/week: 1.0 standard drink of alcohol   Types: 1 Standard drinks or equivalent per week   Comment: Last ETOH use end of 04/2020.    Social History   Substance and Sexual Activity  Drug Use Yes   Types: Marijuana   Comment: occasional use     Family History  Problem Relation Age of Onset   Hypertension Mother    Asthma Father    Asthma Brother    Asthma Daughter    Breast cancer Maternal Aunt    Heart  disease Maternal Grandfather    Heart disease Paternal Grandmother     ALLERGIES:  Tylenol  [acetaminophen ] and Other  She has a current medication list which includes the following prescription(s): desogestrel -ethinyl estradiol .  Physical Exam: -Vitals:  BP (!) 89/66   Pulse 89   Ht 5' 4 (1.626 m)   Wt 178 lb (80.7 kg)   LMP 01/30/2024   BMI 30.55 kg/m   PROCEDURE: Colposcopy performed with 4% acetic acid and Lugol's after informed consent obtained.  Physical Exam Vitals and nursing note reviewed. Exam conducted with a chaperone present.  Constitutional:      Appearance: Normal appearance.  HENT:     Head: Normocephalic and atraumatic.    Eyes:     Extraocular Movements: Extraocular movements intact.   Pulmonary:     Effort: Pulmonary effort is normal.  Genitourinary:    General: Normal vulva.     Vagina: Normal.        Comments: RED = acetowhite lesions and decreased uptake; no abnormal vasculature BLUE = biopsies  Skin:    General: Skin is warm and dry.   Neurological:     General: No focal deficit present.     Mental Status: She is alert.   Psychiatric:        Mood and Affect: Mood normal.                               -Aceto-white Lesions Location(s): See above              -Biopsy performed at 11, 5, 7 o'clock               -ECC indicated and performed: Yes.       -Biopsy sites made hemostatic with pressure and Monsel's solution   -Satisfactory colposcopy: Yes.      -Evidence of Invasive cervical CA :  NO  ASSESSMENT:  Brittany Vaughn is a 23 y.o. [phone] with ASC-H without HPV testing on recent pap (12/04/23), here for colposcopy today, performed as above without complications.  -ECC and 3 cervical bx sent to pathology -Aftercare instructions for home reviewed, si/sx of when to call/return discussed. -Has completed Gardasil series already -Vapes: cessation encouraged -Will call with results   Sofia Dunn, DO [facility] [place]

## 2024-02-04 NOTE — Patient Instructions (Signed)
 Colposcopy, Care After  The following information offers guidance on how to care for yourself after your procedure. Your doctor may also give you more specific instructions. If you have problems or questions, contact your doctor. What can I expect after the procedure? If you did not have a sample of your tissue taken out (did not have a biopsy), you may only have some spotting of blood for a few days. You can go back to your normal activities. If you had a sample of your tissue taken out, it is common to have: Soreness and mild pain. These may last for a few days. Mild bleeding or fluid (discharge) coming from your vagina. The fluid will look dark and grainy. You may have this for a few days. The fluid may be caused by a liquid that was used during your procedure. You may need to wear a sanitary pad. Spotting of blood for at least 48 hours after the procedure. Follow these instructions at home: Medicines Take over-the-counter and prescription medicines only as told by your doctor. Ask your doctor what over-the-counter pain medicines and prescription medicines you can start taking again. This is very important if you take blood thinners. Activity For at least 3 days, or for as long as told by your doctor, avoid: Douching. Using tampons. Having sex. Return to your normal activities as told by your doctor. Ask your doctor what activities are safe for you. General instructions Ask your doctor if you may take baths, swim, or use a hot tub. You may take showers. If you use birth control (contraception), keep using it. Keep all follow-up visits. Contact a doctor if: You have a fever or chills. You faint or feel light-headed. Get help right away if: You bleed a lot from your vagina. A lot of bleeding means that the bleeding soaks through a pad in less than 1 hour. You have clumps of blood (blood clots) coming from your vagina. You have signs that could mean you have an infection. This may be  fluid coming from your vagina that is: Different than normal. Yellow. Bad-smelling. You have very bad pain or cramps in your lower belly that do not get better with medicine. Summary If you did not have a sample of your tissue taken out, you may only have some spotting of blood for a few days. You can go back to your normal activities. If you had a sample of your tissue taken out, it is common to have mild pain for a few days and spotting for 48 hours. Avoid douching, using tampons, and having sex for at least 3 days after the procedure or for as long as told. Get help right away if you have a lot of bleeding, very bad pain, or signs of infection. This information is not intended to replace advice given to you by your health care provider. Make sure you discuss any questions you have with your health care provider. Document Revised: 01/07/2021 Document Reviewed: 01/07/2021 Elsevier Patient Education  2024 [employer].

## 2024-02-05 ENCOUNTER — Ambulatory Visit: Admitting: Obstetrics and Gynecology

## 2024-02-05 ENCOUNTER — Encounter: Admitting: Obstetrics

## 2024-02-05 ENCOUNTER — Other Ambulatory Visit (HOSPITAL_COMMUNITY)
Admission: RE | Admit: 2024-02-05 | Discharge: 2024-02-05 | Disposition: A | Discharge status: Home / Self Care | Source: Ambulatory Visit | Attending: Obstetrics | Admitting: Obstetrics

## 2024-02-05 ENCOUNTER — Ambulatory Visit (INDEPENDENT_AMBULATORY_CARE_PROVIDER_SITE_OTHER): Admitting: Obstetrics

## 2024-02-05 ENCOUNTER — Encounter: Payer: Self-pay | Admitting: Obstetrics

## 2024-02-05 VITALS — BP 89/66 | HR 89 | Ht 64.0 in | Wt 178.0 lb

## 2024-02-05 DIAGNOSIS — R87611 Atypical squamous cells cannot exclude high grade squamous intraepithelial lesion on cytologic smear of cervix (ASC-H): Secondary | ICD-10-CM | POA: Diagnosis present

## 2024-02-05 DIAGNOSIS — Z3202 Encounter for pregnancy test, result negative: Secondary | ICD-10-CM | POA: Diagnosis not present

## 2024-02-05 DIAGNOSIS — D069 Carcinoma in situ of cervix, unspecified: Secondary | ICD-10-CM | POA: Diagnosis not present

## 2024-02-05 LAB — POCT URINE PREGNANCY: Preg Test, Ur: NEGATIVE

## 2024-02-09 LAB — SURGICAL PATHOLOGY

## 2024-02-16 ENCOUNTER — Ambulatory Visit: Payer: Self-pay | Admitting: Obstetrics

## 2024-02-23 ENCOUNTER — Encounter: Payer: Self-pay | Admitting: Obstetrics

## 2024-02-23 NOTE — Telephone Encounter (Signed)
 The front scheduled her to see you on July 11th for this bleeding since colpo. She also has abd pain Do you think she needs to be sooner?
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
## 2024-03-05 ENCOUNTER — Ambulatory Visit: Admitting: Obstetrics

## 2024-03-05 ENCOUNTER — Encounter: Payer: Self-pay | Admitting: Obstetrics

## 2024-03-05 VITALS — BP 95/61 | HR 74 | Ht 64.0 in | Wt 178.0 lb

## 2024-03-05 DIAGNOSIS — Z01818 Encounter for other preprocedural examination: Secondary | ICD-10-CM

## 2024-03-05 DIAGNOSIS — N926 Irregular menstruation, unspecified: Secondary | ICD-10-CM | POA: Diagnosis not present

## 2024-03-05 DIAGNOSIS — N871 Moderate cervical dysplasia: Secondary | ICD-10-CM | POA: Insufficient documentation

## 2024-03-05 DIAGNOSIS — Z23 Encounter for immunization: Secondary | ICD-10-CM

## 2024-03-05 DIAGNOSIS — D069 Carcinoma in situ of cervix, unspecified: Secondary | ICD-10-CM

## 2024-03-05 MED ORDER — DESOGESTREL-ETHINYL ESTRADIOL 0.15-0.02/0.01 MG (21/5) PO TABS: 1.0000 | ORAL_TABLET | Freq: Every day | ORAL | 3 refills | Status: AC

## 2024-03-05 NOTE — Patient Instructions (Signed)
 Loop Electrosurgical Excision Procedure Loop electrosurgical excision procedure (LEEP) is the cutting and removal (excision) of tissue from the cervix. The cervix is the bottom part of the uterus that opens into the vagina. The tissue that is removed from the cervix is examined to see if there are cancer cells or cells that might turn into cancer (precancerous cells). LEEP may be done when: You have abnormal bleeding from your cervix. You have an abnormal Pap test result. Your health care provider finds abnormalities on your cervix during an exam. LEEP typically only takes a few minutes and is often done in the health care provider's office. The procedure is safe for women who are trying to get pregnant. The procedure is usually not done during a menstrual period or during pregnancy. Tell a health care provider about: Any allergies you have. All medicines you are taking, including vitamins, herbs, eye drops, creams, and over-the-counter medicines. Any problems you or family members have had with anesthetic medicines. Any bleeding problems you have. Any medical conditions you have or have had. This includes current or past vaginal infections, such as herpes or STIs (sexually transmitted infections). Whether you are pregnant or may be pregnant. If you are having vaginal bleeding on the day of the procedure. What are the risks? Generally, this is a safe procedure. However, problems may occur, including: Infection. Bleeding. Allergic reactions to medicines. Changes or scarring in the cervix. Damage to nearby structures or organs. Increased risk of early (preterm) labor in future pregnancies. What happens before the procedure? Ask your health care provider about: Changing or stopping your regular medicines. This is especially important if you are taking diabetes medicines or blood thinners. Taking medicines such as aspirin and ibuprofen. These medicines can thin your blood. Do not take these  medicines unless your health care provider tells you to take them. Taking over-the-counter medicines, vitamins, herbs, and supplements. Your health care provider may recommend that you take pain medicine before the procedure. Ask your health care provider if you should plan to have a responsible adult take you home after the procedure. What happens during the procedure?  An instrument called a speculum will be placed in your vagina. This will allow your health care provider to see your cervix. You will be given a medicine to numb the area (local anesthetic). The medicine will be injected into your cervix and the surrounding area. A solution will be applied to your cervix. This solution will help the health care provider find the abnormal cells that need to be removed. A thin wire loop will be passed through your vagina to your cervix. The wire will remove layers of abnormal cervical cells. The wire will burn (cauterize) the cervical tissue with an electrical current during cell removal. Open blood vessels will be cauterized to prevent bleeding. You might feel some pressure, aching, and cramping. If you feel like you will faint during the procedure, tell your health care provider right away. A paste may be applied to the cauterized area of your cervix to help control bleeding. The sample of cervical tissue will be sent to a lab and looked at under a microscope. The procedure may vary among health care providers and hospitals. What can I expect after the procedure? After the procedure, it is common to have: Mild abdominal cramps that may last for up to 1 week. A small amount of pink-tinged or bloody vaginal discharge, including light to moderate bleeding, for 1-2 weeks. A brown- or black-colored discharge coming from your  vagina, if a paste was used on the cervix to control bleeding. It is up to you to get the results of your procedure. Ask your health care provider, or the department that is  doing the procedure, when your results will be ready. Follow these instructions at home: Take over-the-counter and prescription medicines only as told by your health care provider. Return to your normal activities as told by your health care provider. Ask your health care provider what activities are safe for you. Do not put anything in your vagina for 2 weeks after the procedure or until your health care provider says that it is okay. This includes tampons, creams, and douches. Do not have sex until your health care provider approves. Keep all follow-up visits. This is important. Contact a health care provider if: You have a fever or chills. You feel very weak. You have blood clots or bleeding that is heavier than a normal menstrual period. Bleeding that soaks a pad in less than 1 hour is considered heavy bleeding. You develop a bad-smelling discharge from your vagina. You have severe abdominal pain or cramping. Summary Loop electrosurgical excision procedure (LEEP) is the removal of tissue from the cervix. The removed tissue will be checked for precancerous cells or cancer cells. LEEP typically only takes a few minutes and is often done in your health care provider's office. Do not put anything in your vagina for 2 weeks after the procedure or until your health care provider says that it is okay. This includes tampons, creams, and douches. Ask your health care provider, or the department that is doing the procedure, when your results will be ready. This information is not intended to replace advice given to you by your health care provider. Make sure you discuss any questions you have with your health care provider. Document Revised: 01/17/2021 Document Reviewed: 01/17/2021 Elsevier Patient Education  2024 [employer].

## 2024-04-05 ENCOUNTER — Other Ambulatory Visit: Payer: Self-pay

## 2024-04-05 ENCOUNTER — Encounter
Admission: RE | Admit: 2024-04-05 | Discharge: 2024-04-05 | Disposition: A | Discharge status: Home / Self Care | Source: Ambulatory Visit | Attending: Obstetrics | Admitting: Obstetrics

## 2024-04-05 DIAGNOSIS — Z01818 Encounter for other preprocedural examination: Secondary | ICD-10-CM

## 2024-04-05 DIAGNOSIS — N879 Dysplasia of cervix uteri, unspecified: Secondary | ICD-10-CM

## 2024-04-05 DIAGNOSIS — D649 Anemia, unspecified: Secondary | ICD-10-CM

## 2024-04-05 HISTORY — DX: Gastro-esophageal reflux disease without esophagitis: K21.9

## 2024-04-05 HISTORY — DX: Dysplasia of cervix uteri, unspecified: N87.9

## 2024-04-05 HISTORY — DX: Tobacco use: Z72.0

## 2024-04-05 NOTE — Patient Instructions (Signed)
 Your procedure is scheduled on:04-12-24 Monday Report to the Registration Desk on the 1st floor of the [facility].Then proceed to the 2nd floor Surgery Desk To find out your arrival time, please call [phone] between 1PM - 3PM on:04-09-24 Friday If your arrival time is 6:00 am, do not arrive before that time as the [facility] entrance doors do not open until 6:00 am.  REMEMBER: Instructions that are not followed completely may result in serious medical risk, up to and including death; or upon the discretion of your surgeon and anesthesiologist your surgery may need to be rescheduled.  Do not eat food OR drink liquids after midnight the night before surgery.  No gum chewing or hard candies.  One week prior to surgery:Stop NOW (04-05-24) Stop Anti-inflammatories (NSAIDS) such as Advil , Aleve, Ibuprofen , Motrin , Naproxen, Naprosyn and Aspirin  based products such as Excedrin, Goody's Powder, BC Powder. Stop ANY OVER THE COUNTER supplements until after surgery.  You may however, continue to take Tylenol  if needed for pain up until the day of surgery.  Continue taking all of your other prescription medications up until the day of surgery.  Do NOT take any medication the day of surgery   No Alcohol for 24 hours before or after surgery.  No Smoking including e-cigarettes for 24 hours before surgery.  No chewable tobacco products for at least 6 hours before surgery.  No nicotine patches on the day of surgery.  Do not use any recreational drugs for at least a week (preferably 2 weeks) before your surgery.  Please be advised that the combination of cocaine and anesthesia may have negative outcomes, up to and including death. If you test positive for cocaine, your surgery will be cancelled.  On the morning of surgery brush your teeth with toothpaste and water, you may rinse your mouth with mouthwash if you wish. Do not swallow any toothpaste or mouthwash.  Do not wear jewelry, make-up,  hairpins, clips or nail polish.  For welded (permanent) jewelry: bracelets, anklets, waist bands, etc.  Please have this removed prior to surgery.  If it is not removed, there is a chance that hospital personnel will need to cut it off on the day of surgery.  Do not wear lotions, powders, or perfumes.   Do not shave body hair from the neck down 48 hours before surgery.  Contact lenses, hearing aids and dentures may not be worn into surgery.  Do not bring valuables to the hospital. [facility] is not responsible for any missing/lost belongings or valuables.   Notify your doctor if there is any change in your medical condition (cold, fever, infection).  Wear comfortable clothing (specific to your surgery type) to the hospital.  After surgery, you can help prevent lung complications by doing breathing exercises.  Take deep breaths and cough every 1-2 hours. Your doctor may order a device called an Incentive Spirometer to help you take deep breaths. When coughing or sneezing, hold a pillow firmly against your incision with both hands. This is called "splinting." Doing this helps protect your incision. It also decreases belly discomfort.  If you are being admitted to the hospital overnight, leave your suitcase in the car. After surgery it may be brought to your room.  In case of increased patient census, it may be necessary for you, the patient, to continue your postoperative care in the Same Day Surgery department.  If you are being discharged the day of surgery, you will not be allowed to drive home.  You will need a responsible individual to drive you home and stay with you for 24 hours after surgery.   If you are taking public transportation, you will need to have a responsible individual with you.  Please call the Pre-admissions Testing Dept. at [phone] if you have any questions about these instructions.  Surgery Visitation Policy:  Patients having surgery or a procedure may  have two visitors.  Children under the age of 33 must have an adult with them who is not the patient.   Merchandiser, retail to address health-related social needs:  [email or web address]

## 2024-04-09 ENCOUNTER — Encounter
Admission: RE | Admit: 2024-04-09 | Discharge: 2024-04-09 | Disposition: A | Discharge status: Home / Self Care | Source: Ambulatory Visit | Attending: Obstetrics | Admitting: Obstetrics

## 2024-04-09 DIAGNOSIS — R069 Unspecified abnormalities of breathing: Secondary | ICD-10-CM | POA: Diagnosis not present

## 2024-04-09 DIAGNOSIS — D649 Anemia, unspecified: Secondary | ICD-10-CM | POA: Diagnosis not present

## 2024-04-09 DIAGNOSIS — D069 Carcinoma in situ of cervix, unspecified: Secondary | ICD-10-CM

## 2024-04-09 DIAGNOSIS — Z01812 Encounter for preprocedural laboratory examination: Secondary | ICD-10-CM | POA: Insufficient documentation

## 2024-04-09 DIAGNOSIS — N871 Moderate cervical dysplasia: Secondary | ICD-10-CM | POA: Insufficient documentation

## 2024-04-09 LAB — CBC
HCT: 39.7 % (ref 36.0–46.0)
Hemoglobin: 13.5 g/dL (ref 12.0–15.0)
MCH: 28.7 pg (ref 26.0–34.0)
MCHC: 34 g/dL (ref 30.0–36.0)
MCV: 84.5 fL (ref 80.0–100.0)
Platelets: 230 K/uL (ref 150–400)
RBC: 4.7 MIL/uL (ref 3.87–5.11)
RDW: 12 % (ref 11.5–15.5)
WBC: 5.3 K/uL (ref 4.0–10.5)
nRBC: 0 % (ref 0.0–0.2)

## 2024-04-09 LAB — TYPE AND SCREEN
ABO/RH(D): O POS
Antibody Screen: NEGATIVE

## 2024-04-12 ENCOUNTER — Ambulatory Visit
Admission: RE | Admit: 2024-04-12 | Discharge: 2024-04-12 | Disposition: A | Discharge status: Home / Self Care | Attending: Obstetrics | Admitting: Obstetrics

## 2024-04-12 ENCOUNTER — Ambulatory Visit: Payer: Self-pay | Admitting: Urgent Care

## 2024-04-12 ENCOUNTER — Other Ambulatory Visit: Payer: Self-pay

## 2024-04-12 ENCOUNTER — Ambulatory Visit: Admitting: General Practice

## 2024-04-12 ENCOUNTER — Encounter: Payer: Self-pay | Admitting: Obstetrics

## 2024-04-12 ENCOUNTER — Encounter
Admission: RE | Disposition: A | Discharge status: Home / Self Care | Payer: Self-pay | Source: Home / Self Care | Attending: Obstetrics

## 2024-04-12 DIAGNOSIS — D069 Carcinoma in situ of cervix, unspecified: Secondary | ICD-10-CM | POA: Insufficient documentation

## 2024-04-12 DIAGNOSIS — Z5986 Financial insecurity: Secondary | ICD-10-CM | POA: Diagnosis not present

## 2024-04-12 DIAGNOSIS — N871 Moderate cervical dysplasia: Secondary | ICD-10-CM | POA: Diagnosis present

## 2024-04-12 DIAGNOSIS — Z01818 Encounter for other preprocedural examination: Secondary | ICD-10-CM

## 2024-04-12 HISTORY — PX: LEEP: SHX91

## 2024-04-12 LAB — POCT PREGNANCY, URINE: Preg Test, Ur: NEGATIVE

## 2024-04-12 SURGERY — LEEP (LOOP ELECTROSURGICAL EXCISION PROCEDURE)
Anesthesia: General | Site: Cervix

## 2024-04-12 MED ORDER — LIDOCAINE HCL (PF) 2 % IJ SOLN
INTRAMUSCULAR | Status: AC
Start: 2024-04-12 — End: 2024-04-12
  Filled 2024-04-12: qty 5

## 2024-04-12 MED ORDER — FENTANYL CITRATE (PF) 100 MCG/2ML IJ SOLN
INTRAMUSCULAR | Status: DC | PRN
  Administered 2024-04-12 (×2): 50 ug via INTRAVENOUS

## 2024-04-12 MED ORDER — ACETIC ACID 3 % SOLN
Status: DC | PRN
  Administered 2024-04-12: 1 via TOPICAL

## 2024-04-12 MED ORDER — DEXAMETHASONE SODIUM PHOSPHATE 10 MG/ML IJ SOLN
INTRAMUSCULAR | Status: AC
  Filled 2024-04-12: qty 1

## 2024-04-12 MED ORDER — 0.9 % SODIUM CHLORIDE (POUR BTL) OPTIME
TOPICAL | Status: DC | PRN
  Administered 2024-04-12: 500 mL

## 2024-04-12 MED ORDER — SODIUM CHLORIDE 0.9% FLUSH: 3.0000 mL | INTRAVENOUS | Status: DC | PRN

## 2024-04-12 MED ORDER — IODINE STRONG (LUGOLS) 5 % PO SOLN
ORAL | Status: AC
  Filled 2024-04-12: qty 1

## 2024-04-12 MED ORDER — SODIUM CHLORIDE 0.9% FLUSH: 3.0000 mL | Freq: Two times a day (BID) | INTRAVENOUS | Status: DC

## 2024-04-12 MED ORDER — CHLORHEXIDINE GLUCONATE 0.12 % MT SOLN
OROMUCOSAL | Status: AC
  Filled 2024-04-12: qty 15

## 2024-04-12 MED ORDER — FENTANYL CITRATE (PF) 100 MCG/2ML IJ SOLN: 25.0000 ug | INTRAMUSCULAR | Status: DC | PRN

## 2024-04-12 MED ORDER — OXYCODONE HCL 5 MG PO TABS
5.0000 mg | ORAL_TABLET | Freq: Once | ORAL | Status: AC | PRN
  Administered 2024-04-12: 5 mg via ORAL

## 2024-04-12 MED ORDER — MIDAZOLAM HCL 2 MG/2ML IJ SOLN
INTRAMUSCULAR | Status: DC | PRN
  Administered 2024-04-12: 2 mg via INTRAVENOUS

## 2024-04-12 MED ORDER — CHLORHEXIDINE GLUCONATE 0.12 % MT SOLN
15.0000 mL | Freq: Once | OROMUCOSAL | Status: AC
  Administered 2024-04-12: 15 mL via OROMUCOSAL

## 2024-04-12 MED ORDER — LACTATED RINGERS IV SOLN: INTRAVENOUS | Status: DC

## 2024-04-12 MED ORDER — DEXAMETHASONE SODIUM PHOSPHATE 10 MG/ML IJ SOLN
INTRAMUSCULAR | Status: DC | PRN
  Administered 2024-04-12: 4 mg via INTRAVENOUS

## 2024-04-12 MED ORDER — FENTANYL CITRATE (PF) 100 MCG/2ML IJ SOLN
INTRAMUSCULAR | Status: AC
Start: 2024-04-12 — End: 2024-04-12
  Filled 2024-04-12: qty 2

## 2024-04-12 MED ORDER — MIDAZOLAM HCL 2 MG/2ML IJ SOLN
INTRAMUSCULAR | Status: AC
  Filled 2024-04-12: qty 2

## 2024-04-12 MED ORDER — SILVER NITRATE-POT NITRATE 75-25 % EX MISC
CUTANEOUS | Status: AC
  Filled 2024-04-12: qty 10

## 2024-04-12 MED ORDER — OXYCODONE HCL 5 MG/5ML PO SOLN: 5.0000 mg | Freq: Once | ORAL | Status: AC | PRN

## 2024-04-12 MED ORDER — OXYCODONE-ACETAMINOPHEN 5-325 MG PO TABS: 1.0000 | ORAL_TABLET | Freq: Three times a day (TID) | ORAL | 0 refills | Status: DC | PRN

## 2024-04-12 MED ORDER — LIDOCAINE HCL (CARDIAC) PF 100 MG/5ML IV SOSY
PREFILLED_SYRINGE | INTRAVENOUS | Status: DC | PRN
  Administered 2024-04-12: 100 mg via INTRAVENOUS

## 2024-04-12 MED ORDER — ACETIC ACID 3 % SOLN
Status: AC
  Filled 2024-04-12: qty 500

## 2024-04-12 MED ORDER — LIDOCAINE-EPINEPHRINE 1 %-1:100000 IJ SOLN
INTRAMUSCULAR | Status: AC
  Filled 2024-04-12: qty 1

## 2024-04-12 MED ORDER — MONSELS FERRIC SUBSULFATE EX SOLN
CUTANEOUS | Status: AC
  Filled 2024-04-12: qty 8

## 2024-04-12 MED ORDER — PROPOFOL 10 MG/ML IV BOLUS
INTRAVENOUS | Status: DC | PRN
  Administered 2024-04-12: 150 mg via INTRAVENOUS
  Administered 2024-04-12: 50 mg via INTRAVENOUS

## 2024-04-12 MED ORDER — OXYCODONE HCL 5 MG PO TABS
ORAL_TABLET | ORAL | Status: AC
  Filled 2024-04-12: qty 1

## 2024-04-12 MED ORDER — IBUPROFEN 800 MG PO TABS: 800.0000 mg | ORAL_TABLET | Freq: Three times a day (TID) | ORAL | 0 refills | Status: AC | PRN

## 2024-04-12 MED ORDER — ONDANSETRON HCL 4 MG/2ML IJ SOLN
INTRAMUSCULAR | Status: AC
Start: 2024-04-12 — End: 2024-04-12
  Filled 2024-04-12: qty 2

## 2024-04-12 MED ORDER — ONDANSETRON HCL 4 MG/2ML IJ SOLN
INTRAMUSCULAR | Status: DC | PRN
  Administered 2024-04-12: 4 mg via INTRAVENOUS

## 2024-04-12 MED ORDER — ORAL CARE MOUTH RINSE: 15.0000 mL | Freq: Once | OROMUCOSAL | Status: AC

## 2024-04-12 MED ORDER — LIDOCAINE HCL (PF) 1 % IJ SOLN
INTRAMUSCULAR | Status: AC
  Filled 2024-04-12: qty 30

## 2024-04-12 SURGICAL SUPPLY — 31 items
APPLICATOR COTTON TIP 6 STRL (MISCELLANEOUS) ×1 IMPLANT
APPLICATOR SWAB PROCTO LG 16IN (MISCELLANEOUS) ×1 IMPLANT
COUNTER NDL MAGNETIC 40 RED (SET/KITS/TRAYS/PACK) IMPLANT
COUNTER NEEDLE MAGNETIC 40 RED (SET/KITS/TRAYS/PACK) ×1 IMPLANT
DRAPE UNDER BUTTOCK W/FLU (DRAPES) ×1 IMPLANT
DRSG TELFA 3X8 NADH STRL (GAUZE/BANDAGES/DRESSINGS) ×1 IMPLANT
ELECTRODE LEEP BALL 5MM 12CM (MISCELLANEOUS) IMPLANT
ELECTRODE LEEP LOOP 1.0CM .7CM (MISCELLANEOUS) ×1 IMPLANT
ELECTRODE LEP LOOP 20X10 R2010 (MISCELLANEOUS) ×1 IMPLANT
ELECTRODE REM PT RTRN 9FT ADLT (ELECTROSURGICAL) ×1 IMPLANT
GLOVE BIO SURGEON STRL SZ 6 (GLOVE) ×1 IMPLANT
GLOVE BIOGEL PI IND STRL 6 (GLOVE) ×1 IMPLANT
GOWN STRL REUS W/ TWL LRG LVL3 (GOWN DISPOSABLE) ×2 IMPLANT
HANDLE YANKAUER SUCT BULB TIP (MISCELLANEOUS) ×1 IMPLANT
KIT TURNOVER CYSTO (KITS) ×1 IMPLANT
LABEL OR SOLS (LABEL) ×1 IMPLANT
MANIFOLD NEPTUNE II (INSTRUMENTS) ×1 IMPLANT
NDL SPNL 22GX3.5 QUINCKE BK (NEEDLE) ×1 IMPLANT
NEEDLE SPNL 22GX3.5 QUINCKE BK (NEEDLE) IMPLANT
NS IRRIG 500ML POUR BTL (IV SOLUTION) IMPLANT
PACK DNC HYST (MISCELLANEOUS) ×1 IMPLANT
PAD PREP OB/GYN DISP 24X41 (PERSONAL CARE ITEMS) ×1 IMPLANT
PENCIL SMOKE EVACUATOR (MISCELLANEOUS) ×1 IMPLANT
SCOPETTES 8 STERILE (MISCELLANEOUS) IMPLANT
SCRUB CHG 4% DYNA-HEX 4OZ (MISCELLANEOUS) ×1 IMPLANT
SET CYSTO W/LG BORE CLAMP LF (SET/KITS/TRAYS/PACK) IMPLANT
STRAW SMOKE EVAC LEEP 6150 NON (MISCELLANEOUS) ×1 IMPLANT
SUT SILK 2 0 SH (SUTURE) ×1 IMPLANT
SYR 10ML LL (SYRINGE) ×1 IMPLANT
TRAP FLUID SMOKE EVACUATOR (MISCELLANEOUS) ×1 IMPLANT
WATER STERILE IRR 500ML POUR (IV SOLUTION) ×1 IMPLANT

## 2024-04-12 NOTE — H&P (Signed)
 GYNECOLOGY PREOPERATIVE HISTORY AND PHYSICAL   Subjective:  Brittany Vaughn is a 23 y.o. [phone] here for surgical management of CIN3/CIN2.   Indications for procedure include: excision/removal recommended. No significant preoperative concerns.  Proposed surgery: LEEP  Pertinent Gynecological History: Outside pap: ASC-H, no HPV testing 11/2023 Colposcopy: ECC neg, 5:00 CIN3, 7:00 CIN3, 11:00 CIN2   Past Medical History:  Diagnosis Date   Abnormal Pap smear of cervix LGSIL 09/04/20 09/06/2020   Acute idiopathic pericarditis 10/2017 in pregnancy    Anemia    during pregnancy   Cervical intraepithelial neoplasia (CIN)    Depression    third trimester depression (situationaol)   GERD (gastroesophageal reflux disease)    Gestational diabetes mellitus (GDM), 01/11/21 01/15/2021   Current Diabetic Medications:  None   [x] ordered meter, strips, lancets- Date: KN ordered on 5/23  Diabetic Teaching for A1GDM or A2GDM: [x]  [facility] at [facility]- placed on 5/23: apts 01/19/21 & 02/16/21  [ ]  [facility] [ ]  WIC MNT   Baseline and surveillance labs (pulled in from [facility], refresh links as needed)  Lab Results Component Value Date  CREATININE 0.32 (L) 01/07/2021  AST 15 05/20/2018  ALT   Headache    history of migraines   History of depression    third trimester pregnancy (situational per client)   History of urinary tract infection    Marijuana use    Myopericarditis    a. 10/2017 in setting of pregnancy-->chest pain w/ trop of 4.91; b. 10/2017 Echo: EF 50-55%, no rwma.   Ruptured appendicitis 05/15/2018   Vapes nicotine containing substance    Past Surgical History:  Procedure Laterality Date   LAPAROSCOPIC APPENDECTOMY N/A 05/16/2018   Procedure: APPENDECTOMY LAPAROSCOPIC;  Surgeon: Desiderio Schanz, MD;  Location: [facility] ORS;  Service: General;  Laterality: N/A;   OB History  Gravida Para Term Preterm AB Living  2 2 2  0 0 2  SAB IAB Ectopic Multiple Live Births  0 0 0 0 2    # Outcome Date GA Lbr  Len/2nd Weight Sex Type Anes PTL Lv  2 Term 03/30/21 [redacted]w[redacted]d 06:18 / 00:10 3470 g F Vag-Spont None  LIV  1 Term 04/03/18 [redacted]w[redacted]d  3827 g F Vag-Spont   LIV     Birth Comments: [facility], [facility]  Patient denies any other pertinent gynecologic issues.  Family History  Problem Relation Age of Onset   Hypertension Mother    Asthma Father    Asthma Brother    Asthma Daughter    Breast cancer Maternal Aunt    Heart disease Maternal Grandfather    Heart disease Paternal Grandmother    Social History   Socioeconomic History   Marital status: Single    Spouse name: Maple Pouch   Number of children: 1   Years of education: 8   Highest education level: 8th grade  Occupational History   Occupation: homemaker    Comment: unemployed  Tobacco Use   Smoking status: Former    Types: Cigarettes   Smokeless tobacco: Never  Vaping Use   Vaping status: Every Day   Substances: Nicotine, Flavoring   Devices: Vaped only for few weeks. Last used end of 06/2020.  Substance and Sexual Activity   Alcohol use: Not Currently    Alcohol/week: 1.0 standard drink of alcohol    Types: 1 Standard drinks or equivalent per week    Comment: rare   Drug use: Yes    Types: Marijuana    Comment: occ   Sexual  activity: Yes    Partners: Male    Birth control/protection: Pill  Other Topics Concern   Not on file  Social History Narrative   Not on file   Social Drivers of Health   Financial Resource Strain: Medium Risk (09/04/2020)   Overall Financial Resource Strain (CARDIA)    Difficulty of Paying Living Expenses: Somewhat hard  Food Insecurity: Food Insecurity Present (09/04/2020)   Hunger Vital Sign    Worried About Running Out of Food in the Last Year: Sometimes true    Ran Out of Food in the Last Year: Never true  Transportation Needs: No Transportation Needs (09/04/2020)   PRAPARE - Administrator, Civil Service (Medical): No    Lack of Transportation (Non-Medical): No  Physical Activity: Not  on file  Stress: Not on file  Social Connections: Not on file  Intimate Partner Violence: Not At Risk (12/04/2023)   Humiliation, Afraid, Rape, and Kick questionnaire    Fear of Current or Ex-Partner: No    Emotionally Abused: No    Physically Abused: No    Sexually Abused: No   No current facility-administered medications on file prior to encounter.   Current Outpatient Medications on File Prior to Encounter  Medication Sig Dispense Refill   desogestrel -ethinyl estradiol  (MIRCETTE ) 0.15-0.02/0.01 MG (21/5) tablet Take 1 tablet by mouth at bedtime. 84 tablet 3   Allergies  Allergen Reactions   Tylenol  [Acetaminophen ] Shortness Of Breath    Throat edema. Unusual sensations in nostrils. Headache.   Other Swelling    Bee stings/Denies epipen       Review of Systems Constitutional: No recent fever/chills/sweats Respiratory: No recent cough/bronchitis Cardiovascular: No chest pain Gastrointestinal: No recent nausea/vomiting/diarrhea Genitourinary: No UTI symptoms Hematologic/lymphatic:No history of coagulopathy or recent blood thinner use    Objective:   Blood pressure 102/75, pulse 71, temperature (!) 97 F (36.1 C), temperature source Temporal, resp. rate 16, height 5' 4 (1.626 m), weight 83 kg, last menstrual period 03/24/2024, SpO2 98%. CONSTITUTIONAL: Well-developed, well-nourished female in no acute distress.  HENT:  Normocephalic, atraumatic, External right and left ear normal. Oropharynx is clear and moist EYES: Conjunctivae and EOM are normal. Pupils are equal, round, and reactive to light. No scleral icterus.  NECK: Normal range of motion, supple, no masses SKIN: Skin is warm and dry. No rash noted. Not diaphoretic. No erythema. No pallor. NEUROLOGIC: Alert and oriented to person, place, and time. Normal reflexes, muscle tone coordination. No cranial nerve deficit noted. PSYCHIATRIC: Normal mood and affect. Normal behavior. Normal judgment and thought  content. CARDIOVASCULAR: Normal heart rate noted, regular rhythm RESPIRATORY: Effort and breath sounds normal, no problems with respiration noted ABDOMEN: Soft, nontender, nondistended. PELVIC: Deferred MUSCULOSKELETAL: Normal range of motion. No edema and no tenderness. 2+ distal pulses.    Labs: Results for orders placed or performed during the hospital encounter of 04/12/24 (from the past 2 weeks)  Pregnancy, urine POC   Collection Time: 04/12/24  6:31 AM  Result Value Ref Range   Preg Test, Ur NEGATIVE NEGATIVE  Results for orders placed or performed during the hospital encounter of 04/09/24 (from the past 2 weeks)  CBC   Collection Time: 04/09/24 11:10 AM  Result Value Ref Range   WBC 5.3 4.0 - 10.5 K/uL   RBC 4.70 3.87 - 5.11 MIL/uL   Hemoglobin 13.5 12.0 - 15.0 g/dL   HCT 60.2 63.9 - 53.9 %   MCV 84.5 80.0 - 100.0 fL   MCH 28.7 26.0 -  34.0 pg   MCHC 34.0 30.0 - 36.0 g/dL   RDW 87.9 88.4 - 84.4 %   Platelets 230 150 - 400 K/uL   nRBC 0.0 0.0 - 0.2 %  Type and screen   Collection Time: 04/09/24 11:10 AM  Result Value Ref Range   ABO/RH(D) O POS    Antibody Screen NEG    Sample Expiration 04/23/2024,2359    Extend sample reason      NO TRANSFUSIONS OR PREGNANCY IN THE PAST 3 MONTHS Performed at [facility], [address], [place], [place] [address]      Imaging Studies: No results found.  Assessment:     23 y.o. 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   Plan:    Counseling: Procedure, risks, reasons, benefits and complications reviewed in detail. Likelihood of success in alleviating the patient's condition was discussed. Routine postoperative instructions will be reviewed with the patient and her family in detail after surgery.  The patient concurred with the proposed plan, giving informed written consent for the surgery.  To OR when ready.    Estil Mangle, DO [facility]

## 2024-04-12 NOTE — Anesthesia Preprocedure Evaluation (Signed)
 Anesthesia Evaluation  Patient identified by MRN, date of birth, ID band Patient awake    Reviewed: Allergy & Precautions, H&P , NPO status , Patient's Chart, lab work & pertinent test results  History of Anesthesia Complications Negative for: history of anesthetic complications  Airway Mallampati: II  TM Distance: >3 FB Neck ROM: full    Dental  (+) Teeth Intact, Dental Advidsory Given   Pulmonary neg shortness of breath, asthma , former smoker   breath sounds clear to auscultation       Cardiovascular (-) hypertension(-) angina negative cardio ROS  Rhythm:regular Rate:Normal     Neuro/Psych  Headaches PSYCHIATRIC DISORDERS  Depression    negative neurological ROS  negative psych ROS   GI/Hepatic negative GI ROS, Neg liver ROS,,,  Endo/Other  negative endocrine ROSdiabetes    Renal/GU      Musculoskeletal   Abdominal   Peds  Hematology negative hematology ROS (+) Blood dyscrasia, anemia   Anesthesia Other Findings Past Medical History: No date: Myopericarditis     Comment:  a. 10/2017 in setting of pregnancy-->chest pain w/ trop               of 4.91; b. 10/2017 Echo: EF 50-55%, no rwma.  History reviewed. No pertinent surgical history.  BMI    Body Mass Index:  25.75 kg/m      Reproductive/Obstetrics negative OB ROS                              Anesthesia Physical Anesthesia Plan  ASA: 2  Anesthesia Plan: General   Post-op Pain Management:    Induction: Intravenous  PONV Risk Score and Plan: 3 and Ondansetron , Dexamethasone  and Midazolam   Airway Management Planned: LMA  Additional Equipment:   Intra-op Plan:   Post-operative Plan: Extubation in OR  Informed Consent: I have reviewed the patients History and Physical, chart, labs and discussed the procedure including the risks, benefits and alternatives for the proposed anesthesia with the patient or authorized  representative who has indicated his/her understanding and acceptance.     Dental Advisory Given  Plan Discussed with: Anesthesiologist, CRNA and Surgeon  Anesthesia Plan Comments: (Patient consented for risks of anesthesia including but not limited to:  - adverse reactions to medications - damage to eyes, teeth, lips or other oral mucosa - nerve damage due to positioning  - sore throat or hoarseness - Damage to heart, brain, nerves, lungs, other parts of body or loss of life  Patient voiced understanding and assent.)        Anesthesia Quick Evaluation

## 2024-04-12 NOTE — Anesthesia Postprocedure Evaluation (Signed)
 Anesthesia Post Note  Patient: Brittany Vaughn  Procedure(s) Performed: LEEP (LOOP ELECTROSURGICAL EXCISION PROCEDURE) (Cervix)  Patient location during evaluation: PACU Anesthesia Type: General Level of consciousness: awake and alert Pain management: pain level controlled Vital Signs Assessment: post-procedure vital signs reviewed and stable Respiratory status: spontaneous breathing, nonlabored ventilation and respiratory function stable Cardiovascular status: blood pressure returned to baseline and stable Postop Assessment: no apparent nausea or vomiting Anesthetic complications: no   No notable events documented.   Last Vitals:  Vitals:   04/12/24 0900 04/12/24 0913  BP: 102/77 124/79  Pulse: (!) 53 (!) 52  Resp: 19 16  Temp: (!) 36.1 C (!) 36.1 C  SpO2: 98% 100%    Last Pain:  Vitals:   04/12/24 0913  TempSrc: Temporal  PainSc: 0-No pain                 Camellia Merilee Louder

## 2024-04-12 NOTE — Transfer of Care (Signed)
 Immediate Anesthesia Transfer of Care Note  Patient: Brittany Vaughn  Procedure(s) Performed: LEEP (LOOP ELECTROSURGICAL EXCISION PROCEDURE) (Cervix)  Patient Location: PACU  Anesthesia Type:General  Level of Consciousness: awake and alert   Airway & Oxygen Therapy: Patient Spontanous Breathing and Patient connected to nasal cannula oxygen  Post-op Assessment: Report given to RN and Post -op Vital signs reviewed and stable  Post vital signs: Reviewed and stable  Last Vitals:  Vitals Value Taken Time  BP 113/91 04/12/24 08:26  Temp    Pulse 77 04/12/24 08:28  Resp    SpO2 100 % 04/12/24 08:28  Vitals shown include unfiled device data.  Last Pain:  Vitals:   04/12/24 0655  TempSrc: Temporal  PainSc: 0-No pain         Complications: No notable events documented.

## 2024-04-12 NOTE — Op Note (Signed)
 LEEP PROCEDURE NOTE   DATE: 04/12/24  PREOPERATIVE DIAGNOSIS: CIN3/CIN2  POSTOPERATIVE DIAGNOSIS: same, s/p LEEP  PROCEDURE: LEEP   SURGEON: Jazlynn Nemetz, DO  UOP: 3ml  EBL: 50cc  SPECIMENS:   ID Type Source Tests Collected by Time Destination  1 : LEEP stitch at 12 o'clock Tissue PATH Gyn biopsy SURGICAL PATHOLOGY Leigh Sober, MD 04/12/2024 [phone]    DISPOSITION: Stable to recovery room  PROCEDURE DETAILS:  Brittany Vaughn is a 23 y.o. y.o. [id number] female here for LEEP. No GYN concerns. Pap smear and colposcopy reviewed.    Pap: ASC-H, no HPV testing (12/04/23) Colpo Biopsy: 5:00 and 7:00 CIN3,  11:00 CIN2 ECC: NEG  Risks, benefits, alternatives, and limitations of procedure explained to patient, including pain, bleeding, infection, failure to remove abnormal tissue and failure to cure dysplasia, need for repeat procedures, damage to pelvic organs, cervical incompetence.  Role of HPV,cervical dysplasia and need for close followup was empasized. Informed written consent was obtained. All questions were answered. Time out performed.   ??Procedure: The patient was placed in lithotomy position and the bivalved coated speculum was placed in the patient's vagina. A grounding pad placed on the patient. Lugol's solution was applied to the cervix and areas of decreased uptake were noted at 5, 7, 11 o'clock.  The suction was turned on and the large, shallow Fischer Cone Biopsy excisior on 79 Watts of cutting current was used to excise the area of decreased uptake and excise the entire transformation zone. Excellent hemostasis was achieved using roller ball coagulation set at 60 Watts coagulation current. Astringent solution was then applied and the speculum was removed from the vagina. Specimens were sent to pathology.  ?The patient tolerated the procedure well. Post-operative instructions given to the patient, including instruction to seek medical attention for persistent bright red bleeding, fever,  abdominal/pelvic pain, dysuria, nausea or vomiting. She was also told about the possibility of having copious yellow to black tinged discharge for weeks. She was counseled to avoid anything in the vagina (sex/douching/tampons) for 3 weeks. She has a 2 week post-operative check to assess wound healing, review results and discuss further management.    Sober Leigh, DO [facility]

## 2024-04-13 ENCOUNTER — Encounter: Payer: Self-pay | Admitting: Obstetrics

## 2024-04-15 LAB — SURGICAL PATHOLOGY

## 2024-04-16 ENCOUNTER — Encounter: Payer: Self-pay | Admitting: Obstetrics

## 2024-04-21 NOTE — Progress Notes (Deleted)
    OBSTETRICS/GYNECOLOGY POST-OPERATIVE CLINIC VISIT  Subjective:     Brittany Vaughn is a 23 y.o. female who presents to the clinic 2 weeks status post Leep for CIN3/CIN2 . Eating a regular diet {with-without:5700} difficulty. Bowel movements are {normal/abnormal***:19619}. {pain control:13522::The patient is not having any pain.}  {Common ambulatory SmartLinks:19316}  Review of Systems {ros; complete:30496}   Objective:   LMP 03/24/2024 (Exact Date)  There is no height or weight on file to calculate BMI.  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       - NEGATIVE FOR INVASION.       - SURGICAL RESECTION MARGINS NEGATIVE.     Assessment:   Patient s/p *** (surgery)  {doing well:13525::Doing well postoperatively.}   Plan:   1. Continue any current medications as instructed by provider. 2. Wound care discussed. 3. Operative findings again reviewed. Pathology report discussed. 4. Activity restrictions: {restrictions:13723} 5. Anticipated return to work: {work return:14002}. 6. Follow up: {8-89:86212} {time; units:18646} for ***    Jakie Raisin, [facility]

## 2024-04-27 ENCOUNTER — Encounter: Admitting: Obstetrics

## 2024-04-27 DIAGNOSIS — Z09 Encounter for follow-up examination after completed treatment for conditions other than malignant neoplasm: Secondary | ICD-10-CM

## 2024-04-29 NOTE — Progress Notes (Deleted)
    OBSTETRICS/GYNECOLOGY POST-OPERATIVE CLINIC VISIT  Subjective:     Brittany Vaughn is a 23 y.o. female who presents to the clinic 3 weeks status post Leep for CIN3/CIN2 . Eating a regular diet {with-without:5700} difficulty. Bowel movements are {normal/abnormal***:19619}. {pain control:13522::The patient is not having any pain.}  {Common ambulatory SmartLinks:19316}  Review of Systems {ros; complete:30496}   Objective:   LMP 03/24/2024 (Exact Date)  There is no height or weight on file to calculate BMI.  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       - NEGATIVE FOR INVASION.       - SURGICAL RESECTION MARGINS NEGATIVE.     Assessment:   Patient s/p *** (surgery)  {doing well:13525::Doing well postoperatively.}   Plan:   1. Continue any current medications as instructed by provider. 2. Wound care discussed. 3. Operative findings again reviewed. Pathology report discussed. 4. Activity restrictions: {restrictions:13723} 5. Anticipated return to work: {work return:14002}. 6. Follow up: {8-89:86212} {time; units:18646} for ***    Jakie Raisin, [facility]

## 2024-05-04 ENCOUNTER — Encounter: Admitting: Obstetrics

## 2024-05-04 NOTE — Progress Notes (Deleted)
    OBSTETRICS/GYNECOLOGY POST-OPERATIVE CLINIC VISIT  Subjective:     Brittany Vaughn is a 23 y.o. female who presents to the clinic 3 weeks status post Leep for CIN3/CIN2 . Eating a regular diet {with-without:5700} difficulty. Bowel movements are {normal/abnormal***:19619}. {pain control:13522::The patient is not having any pain.}  {Common ambulatory SmartLinks:19316}  Review of Systems {ros; complete:30496}   Objective:   LMP 03/24/2024 (Exact Date)  There is no height or weight on file to calculate BMI.  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       - NEGATIVE FOR INVASION.       - SURGICAL RESECTION MARGINS NEGATIVE.     Assessment:   Patient s/p *** (surgery)  {doing well:13525::Doing well postoperatively.}   Plan:   1. Continue any current medications as instructed by provider. 2. Wound care discussed. 3. Operative findings again reviewed. Pathology report discussed. 4. Activity restrictions: {restrictions:13723} 5. Anticipated return to work: {work return:14002}. 6. Follow up: {8-89:86212} {time; units:18646} for ***    Vicci Rollo BRAVO, RN [facility]

## 2024-05-13 NOTE — Progress Notes (Unsigned)
    OBSTETRICS/GYNECOLOGY POST-OPERATIVE CLINIC VISIT  Subjective:     Brittany Vaughn is a 23 y.o. 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 Eating a regular diet {with-without:5700} difficulty. Bowel movements are {normal/abnormal***:19619}. {pain control:13522::The patient is not having any pain.}  {Common ambulatory SmartLinks:19316}  Review of Systems {ros; complete:30496}   Objective:   There were no vitals taken for this visit. There is no height or weight on file to calculate BMI.  General:  alert and no distress  Abdomen: soft, bowel sounds active, non-tender  Incision:   {incision:13716::no dehiscence,incision well approximated,healing well,no drainage,no erythema,no hernia,no seroma,no swelling}    Pathology:    Assessment:   Patient s/p LEEP (surgery)  {doing well:13525::Doing well postoperatively.}   Plan:   1. Continue any current medications as instructed by provider. 2. Wound care discussed. 3. Operative findings again reviewed. Pathology report discussed. 4. Activity restrictions: {restrictions:13723} 5. Anticipated return to work: {work return:14002}. 6. Follow up: {8-89:86212} {time; units:18646} for ***    Estil Mangle, DO [facility]

## 2024-05-14 ENCOUNTER — Encounter: Payer: Self-pay | Admitting: Obstetrics

## 2024-05-14 ENCOUNTER — Ambulatory Visit: Admitting: Obstetrics

## 2024-05-14 VITALS — BP 107/78 | HR 83 | Ht 64.0 in | Wt 170.2 lb

## 2024-05-14 DIAGNOSIS — Z4889 Encounter for other specified surgical aftercare: Secondary | ICD-10-CM

## 2024-05-14 DIAGNOSIS — D069 Carcinoma in situ of cervix, unspecified: Secondary | ICD-10-CM

## 2024-05-14 DIAGNOSIS — Z9889 Other specified postprocedural states: Secondary | ICD-10-CM

## 2024-05-20 ENCOUNTER — Other Ambulatory Visit: Payer: Self-pay | Admitting: Medical Genetics

## 2024-05-24 NOTE — Progress Notes (Signed)
 Colpo completed by Estil Mangle, MD with [facility] on 02/05/24.  Reference surgical pathology lab result in Epic dated 02/05/24.  03/05/24 Dr. Mangle f/u visit: 1. High grade squamous intraepithelial lesion (HGSIL), grade 3 CIN, on biopsy of cervix   2. High grade squamous intraepithelial lesion (HGSIL), grade 2 CIN, on biopsy of cervix   3. Irregular menstrual cycle   4. Need for HPV vaccination   5. 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 We reviewed ASCCP recommendation for excision and discussed LEEP procedure in detail, including R/B/A. Pt desires to have in same-day with sedation.   04/12/24 LEEP performed.  05/14/24 Post Op f/u notes and recommendations: Plan:  Pelvic rest for 1-2 more weeks May have spotting with tampon use and intercourse, as that central area continues to heal.  Gardasil #2 due Dec '25 Establish with PCP for headaches Follow up 71yr for repeat cotesting/pap, sooner prn.

## 2024-06-01 ENCOUNTER — Other Ambulatory Visit

## 2024-06-24 ENCOUNTER — Encounter: Payer: Self-pay | Admitting: Obstetrics

## 2024-07-05 NOTE — Progress Notes (Deleted)
    NURSE VISIT NOTE  Subjective:    Patient ID: Bexleigh Theriault, female    DOB: Apr 03, 2001, 23 y.o.   MRN: [id number]  HPI  Patient is a 23 y.o. G30P2002 female Single Caucasian female who presents for her third Gardasil injection. Order to administer given by Estil Mangle, MD on 03/05/2024.   Objective:    There were no vitals taken for this visit.  23 y.o. LMP:  ***  Contraception:  {CCO Contraception:21020264} Given by: Burnard Ro, CMA Site:  {left/right:311354} deltoid  Lab Review  No results found for any visits on 07/06/24.    Assessment:   No diagnosis found.   Plan:   Patient will return in {Gardasil Return Visit:28539} for {FIRST SECOND THIRD:18671} injection.    Burnard LITTIE Ro, CMA

## 2024-07-06 ENCOUNTER — Ambulatory Visit

## 2024-07-06 DIAGNOSIS — Z23 Encounter for immunization: Secondary | ICD-10-CM

## 2024-09-28 ENCOUNTER — Ambulatory Visit

## 2024-09-28 DIAGNOSIS — D649 Anemia, unspecified: Secondary | ICD-10-CM | POA: Insufficient documentation

## 2024-11-24 ENCOUNTER — Ambulatory Visit
